# Patient Record
Sex: Female | Born: 1975 | Race: White | Hispanic: No | Marital: Married | Smoking: Never smoker
Health system: Southern US, Community
[De-identification: ages and names within clinical notes are randomized; demographics above are authoritative.]

## PROBLEM LIST (undated history)

## (undated) DIAGNOSIS — T7840XA Allergy, unspecified, initial encounter: Secondary | ICD-10-CM

## (undated) HISTORY — PX: HERNIA REPAIR: SHX51

## (undated) HISTORY — PX: RHINOPLASTY: SUR1284

## (undated) HISTORY — PX: AUGMENTATION MAMMAPLASTY: SUR837

## (undated) HISTORY — PX: TONSILLECTOMY: SUR1361

## (undated) HISTORY — DX: Allergy, unspecified, initial encounter: T78.40XA

---

## 2001-12-21 ENCOUNTER — Other Ambulatory Visit: Admission: RE | Admit: 2001-12-21 | Discharge: 2001-12-21 | Payer: Self-pay | Admitting: Obstetrics and Gynecology

## 2002-04-07 ENCOUNTER — Inpatient Hospital Stay (HOSPITAL_COMMUNITY): Admission: AD | Admit: 2002-04-07 | Discharge: 2002-04-07 | Payer: Self-pay | Admitting: Obstetrics and Gynecology

## 2002-05-16 ENCOUNTER — Inpatient Hospital Stay (HOSPITAL_COMMUNITY): Admission: AD | Admit: 2002-05-16 | Discharge: 2002-05-16 | Payer: Self-pay | Admitting: Obstetrics and Gynecology

## 2004-06-21 ENCOUNTER — Emergency Department: Payer: Self-pay | Admitting: Emergency Medicine

## 2006-10-15 ENCOUNTER — Observation Stay: Payer: Self-pay

## 2006-11-05 ENCOUNTER — Observation Stay: Payer: Self-pay | Admitting: Obstetrics and Gynecology

## 2006-11-06 ENCOUNTER — Observation Stay: Payer: Self-pay

## 2006-11-12 ENCOUNTER — Ambulatory Visit: Payer: Self-pay | Admitting: Obstetrics and Gynecology

## 2009-03-31 ENCOUNTER — Emergency Department: Payer: Self-pay | Admitting: Unknown Physician Specialty

## 2009-04-01 ENCOUNTER — Inpatient Hospital Stay: Payer: Self-pay | Admitting: Psychiatry

## 2009-07-14 ENCOUNTER — Ambulatory Visit: Payer: Self-pay

## 2009-07-20 ENCOUNTER — Ambulatory Visit: Payer: Self-pay

## 2009-07-21 HISTORY — PX: DILATION AND CURETTAGE OF UTERUS: SHX78

## 2009-07-25 LAB — PATHOLOGY REPORT

## 2010-12-07 ENCOUNTER — Ambulatory Visit: Payer: Self-pay | Admitting: Otolaryngology

## 2011-02-06 ENCOUNTER — Ambulatory Visit: Payer: Self-pay | Admitting: Otolaryngology

## 2011-02-07 LAB — PATHOLOGY REPORT

## 2011-09-11 ENCOUNTER — Emergency Department: Payer: Self-pay | Admitting: Emergency Medicine

## 2012-04-09 ENCOUNTER — Emergency Department: Payer: Self-pay | Admitting: Emergency Medicine

## 2012-04-09 LAB — CBC
HGB: 13.8 g/dL (ref 12.0–16.0)
MCH: 33.4 pg (ref 26.0–34.0)
Platelet: 212 10*3/uL (ref 150–440)
RDW: 12.2 % (ref 11.5–14.5)

## 2012-04-09 LAB — LIPASE, BLOOD: Lipase: 242 U/L (ref 73–393)

## 2012-04-09 LAB — COMPREHENSIVE METABOLIC PANEL
Albumin: 4.3 g/dL (ref 3.4–5.0)
Alkaline Phosphatase: 42 U/L — ABNORMAL LOW (ref 50–136)
BUN: 14 mg/dL (ref 7–18)
Calcium, Total: 8.5 mg/dL (ref 8.5–10.1)
EGFR (Non-African Amer.): >60
Glucose: 91 mg/dL (ref 65–99)
Osmolality: 274 (ref 275–301)
Potassium: 4 mmol/L (ref 3.5–5.1)
SGOT(AST): 12 U/L — ABNORMAL LOW (ref 15–37)
Sodium: 137 mmol/L (ref 136–145)

## 2012-04-09 LAB — URINALYSIS, COMPLETE
Ketone: NEGATIVE
Nitrite: NEGATIVE
Ph: 7 (ref 4.5–8.0)
Protein: NEGATIVE
RBC,UR: <1 /HPF (ref 0–5)
Specific Gravity: 1.003 (ref 1.003–1.030)
Squamous Epithelial: <1

## 2013-10-29 ENCOUNTER — Ambulatory Visit: Payer: Self-pay

## 2014-05-15 NOTE — Op Note (Signed)
PATIENT NAME:  Donna James, Donna James MR#:  [id number] DATE OF BIRTH:  03/14/1975  DATE OF PROCEDURE:  02/06/2011  PREOPERATIVE DIAGNOSIS: Right chronic sialadenitis with sialolithiasis.    POSTOPERATIVE DIAGNOSIS: Right chronic sialadenitis with sialolithiasis.    PROCEDURE: Excision of right submandibular gland and stone.   SURGEON: Ollen Grossaul S. Willeen CassBennett, MD  FIRST ASSISTANT: Karlyne GreenspanWilliam Vaught, M.D.   ANESTHESIA: General endotracheal.   INDICATIONS: 39 year old female with chronic recurrent swelling of the right submandibular gland with a stone noted within the gland on CT scan.   FINDINGS: The gland was firm and appeared chronically inflamed with adhesions over the capsule of the gland and about a 5 mm stone near the hilum of the duct.   COMPLICATIONS: None.   DESCRIPTION OF PROCEDURE: After obtaining informed consent, the patient was taken to the Operating Room, placed in supine position. After induction of general endotracheal anesthesia, the patient was turned 90 degrees and placed on a shoulder roll. The skin was injected with 1% lidocaine with epinephrine 1:200,000. She was then prepped and draped in the usual sterile fashion. A 15 blade was used to incise the skin. The incision subsequently carried through the subcutaneous tissues and through the platysma. Care was taken during dissection to watch for any movement related to the branches of the marginal mandibular nerve. With dissection proceeding deep to the platysma the lower border of the submandibular gland was dissected out and identified. The facial vein was clamped, suture ligated and retracted superiorly to help protect the marginal mandibular nerve. Dissection then proceeded over the capsule of the gland. The marginal mandibular nerve was not identified nor was there any twitching of the lower lip during the dissection and I was able to safely dissect the gland away with careful dissection using the Harmonic scalpel to divide fascial  attachments. There were some fascial adhesions to the gland because of inflammation. The branches from the facial artery were identified and divided with the Harmonic scalpel. The gland was dissected away from the mylohyoid muscle which was identified. More superiorly there was an accessory gland that was pulled down and dissected out and then the lingual nerve identified. The ganglion branch from the lingual nerve was divided with the Harmonic scalpel preserving the lingual nerve itself. The stone was identified in the hilum of the duct and the mylohyoid muscle lifted in the duct, divided up under the mylohyoid muscle. The gland was then delivered. The stone was removed from the hilum of the duct to give to the patient and the gland sent for pathology. Palpated the floor of the mouth under the mylohyoid muscle and there was no residual stone palpable. The wound was irrigated with saline and a #7 TLS drain placed through the skin and secured with a 5-0 Prolene suture. The platysmal layer was closed with 4-0 Vicryl suture in an interrupted fashion. The subcutaneous tissues were then further closed with 4-0 Vicryl and the skin closed with a 5-0 Prolene running lock stitch. Patient was then returned to the anesthesiologist for awakening. Bacitracin ointment was placed in the wound. She was awakened and taken to the recovery room in good condition postoperatively. Blood loss was less than 25 mL.  ____________________________ Ollen GrossPaul S. Willeen CassBennett, MD psb:cms D: 02/06/2011 09:08:51 ET James: 02/06/2011 09:25:38 ET JOB#: [id number]  cc: Ollen GrossPaul S. Willeen CassBennett, MD, <Dictator> Sandi MealyPAUL S Zoey Gilkeson MD ELECTRONICALLY SIGNED 02/20/2011 17:51

## 2017-01-24 ENCOUNTER — Ambulatory Visit: Payer: Self-pay | Admitting: Internal Medicine

## 2017-01-24 DIAGNOSIS — Z0289 Encounter for other administrative examinations: Secondary | ICD-10-CM

## 2017-05-08 ENCOUNTER — Encounter: Payer: Self-pay | Admitting: *Deleted

## 2017-05-13 ENCOUNTER — Encounter: Payer: Self-pay | Admitting: General Surgery

## 2017-05-13 ENCOUNTER — Ambulatory Visit: Payer: BC Managed Care – PPO | Admitting: General Surgery

## 2017-05-13 VITALS — BP 112/78 | HR 64 | Resp 12 | Ht 63.0 in | Wt 116.0 lb

## 2017-05-13 DIAGNOSIS — K429 Umbilical hernia without obstruction or gangrene: Secondary | ICD-10-CM | POA: Diagnosis not present

## 2017-05-13 NOTE — Patient Instructions (Addendum)
May apply ice to the area for comfort as needed. Tylenol, aleve or pain medication of choice as needed for comfort. Follow up as needed or sooner if symptoms worsen    Hernia, Adult A hernia is the bulging of an organ or tissue through a weak spot in the muscles of the abdomen (abdominal wall). Hernias develop most often near the navel or groin. There are many kinds of hernias. Common kinds include:  Femoral hernia. This kind of hernia develops under the groin in the upper thigh area.  Inguinal hernia. This kind of hernia develops in the groin or scrotum.  Umbilical hernia. This kind of hernia develops near the navel.  Hiatal hernia. This kind of hernia causes part of the stomach to be pushed up into the chest.  Incisional hernia. This kind of hernia bulges through a scar from an abdominal surgery.  What are the causes? This condition may be caused by:  Heavy lifting.  Coughing over a long period of time.  Straining to have a bowel movement.  An incision made during an abdominal surgery.  A birth defect (congenital defect).  Excess weight or obesity.  Smoking.  Poor nutrition.  Cystic fibrosis.  Excess fluid in the abdomen.  Undescended testicles.  What are the signs or symptoms? Symptoms of a hernia include:  A lump on the abdomen. This is the first sign of a hernia. The lump may become more obvious with standing, straining, or coughing. It may get bigger over time if it is not treated or if the condition causing it is not treated.  Pain. A hernia is usually painless, but it may become painful over time if treatment is delayed. The pain is usually dull and may get worse with standing or lifting heavy objects.  Sometimes a hernia gets tightly squeezed in the weak spot (strangulated) or stuck there (incarcerated) and causes additional symptoms. These symptoms may include:  Vomiting.  Nausea.  Constipation.  Irritability.  How is this diagnosed? A hernia  may be diagnosed with:  A physical exam. During the exam your health care provider may ask you to cough or to make a specific movement, because a hernia is usually more visible when you move.  Imaging tests. These can include: ? X-rays. ? Ultrasound. ? CT scan.  How is this treated? A hernia that is small and painless may not need to be treated. A hernia that is large or painful may be treated with surgery. Inguinal hernias may be treated with surgery to prevent incarceration or strangulation. Strangulated hernias are always treated with surgery, because lack of blood to the trapped organ or tissue can cause it to die. Surgery to treat a hernia involves pushing the bulge back into place and repairing the weak part of the abdomen. Follow these instructions at home:  Avoid straining.  Do not lift anything heavier than 10 lb (4.5 kg).  Lift with your leg muscles, not your back muscles. This helps avoid strain.  When coughing, try to cough gently.  Prevent constipation. Constipation leads to straining with bowel movements, which can make a hernia worse or cause a hernia repair to break down. You can prevent constipation by: ? Eating a high-fiber diet that includes plenty of fruits and vegetables. ? Drinking enough fluids to keep your urine clear or pale yellow. Aim to drink 6-8 glasses of water per day. ? Using a stool softener as directed by your health care provider.  Lose weight, if you are overweight.  Do  not use any tobacco products, including cigarettes, chewing tobacco, or electronic cigarettes. If you need help quitting, ask your health care provider.  Keep all follow-up visits as directed by your health care provider. This is important. Your health care provider may need to monitor your condition. Contact a health care provider if:  You have swelling, redness, and pain in the affected area.  Your bowel habits change. Get help right away if:  You have a fever.  You have  abdominal pain that is getting worse.  You feel nauseous or you vomit.  You cannot push the hernia back in place by gently pressing on it while you are lying down.  The hernia: ? Changes in shape or size. ? Is stuck outside the abdomen. ? Becomes discolored. ? Feels hard or tender. This information is not intended to replace advice given to you by your health care provider. Make sure you discuss any questions you have with your health care provider. Document Released: 01/07/2005 Document Revised: 06/07/2015 Document Reviewed: 11/17/2013 Elsevier Interactive Patient Education  2017 [employer]

## 2017-05-13 NOTE — Progress Notes (Signed)
Patient ID: Donna James, female   DOB: 10-30-1975, 42 y.o.   MRN: [id number]  Chief Complaint  Patient presents with  . Other    HPI Donna James is a 42 y.o. female.  Patient here today for an evaluation of a possible hernia referred by Lucretia Kern.  She states that she has noticed a umbilical knot for about 1 week  It does seem to be causing some abdominal pain.  She does admit to nausea but no vomiting, constipation or diarrhea noted. She states they are in the process of moving but she has not lifted anything heavy. Sh e does admit to a lot of allergies and has been sneezing more. She is here with her son, Donna James. She is a hair stylist.  HPI  Past Medical History:  Diagnosis Date  . Allergy       Family History  Problem Relation Age of Onset  . Diabetes Mother   . Diabetes Father     Social History Social History   Tobacco Use  . Smoking status: Never Smoker  . Smokeless tobacco: Never Used  Substance Use Topics  . Alcohol use: Yes    Alcohol/week: 0.6 oz    Types: 1 Glasses of wine per week  . Drug use: Never    Allergies  Allergen Reactions  . Pear Hives    Current Outpatient Medications  Medication Sig Dispense Refill  . loratadine (CLARITIN) 10 MG tablet Take 10 mg by mouth daily.     No current facility-administered medications for this visit.     Review of Systems Review of Systems  Constitutional: Negative.   Respiratory: Negative.   Cardiovascular: Negative.   Gastrointestinal: Positive for abdominal pain and nausea. Negative for constipation and diarrhea.    Blood pressure 112/78, pulse 64, resp. rate 12, height 5\' 3"  (1.6 m), weight 116 lb (52.6 kg), last menstrual period 04/26/2017, SpO2 98 %.  Physical Exam Physical Exam  Constitutional: She is oriented to person, place, and time. She appears well-developed and well-nourished.  HENT:  Mouth/Throat: Oropharynx is clear and moist.  Eyes: Conjunctivae are  normal. No scleral icterus.  Neck: Neck supple.  Cardiovascular: Normal rate, regular rhythm and normal heart sounds.  Pulmonary/Chest: Effort normal and breath sounds normal.  Abdominal: Soft. Normal appearance and bowel sounds are normal. There is no tenderness. A hernia is present.    Tiny umbilical hernia   Lymphadenopathy:    She has no cervical adenopathy.  Neurological: She is alert and oriented to person, place, and time.  Skin: Skin is warm and dry.  Psychiatric: Her behavior is normal.       Assessment    Possible small umbilical hernia.     Plan    I anticipate this area will become asymptomatic in the coming weeks, and does not warrant  intervention unless it fails to improve or enlarges.  No change in activity required.  May apply ice to the area for comfort as needed. Tylenol, aleve or pain medication of choice as needed for comfort.  Follow up as needed or sooner if symptoms worsen       HPI, Physical Exam, Assessment and Plan have been scribed under the direction and in the presence of Earline Mayotte, MD. Dorathy Daft, RN  I have completed the exam and reviewed the above documentation for accuracy and completeness.  I agree with the above.  Museum/gallery conservator has been used and any errors in dictation or transcription are unintentional.  Donnalee CurryJeffrey Cena Bruhn, M.D., F.A.C.S.  Merrily PewJeffrey W Mabrey Howland 05/13/2017, 9:50 PM

## 2018-05-21 ENCOUNTER — Other Ambulatory Visit: Payer: Self-pay

## 2018-05-21 ENCOUNTER — Other Ambulatory Visit (HOSPITAL_COMMUNITY)
Admission: RE | Admit: 2018-05-21 | Discharge: 2018-05-21 | Disposition: A | Discharge status: Home / Self Care | Payer: BC Managed Care – PPO | Source: Ambulatory Visit | Attending: Maternal Newborn | Admitting: Maternal Newborn

## 2018-05-21 ENCOUNTER — Ambulatory Visit (INDEPENDENT_AMBULATORY_CARE_PROVIDER_SITE_OTHER): Payer: BC Managed Care – PPO | Admitting: Maternal Newborn

## 2018-05-21 ENCOUNTER — Encounter: Payer: Self-pay | Admitting: Maternal Newborn

## 2018-05-21 VITALS — BP 98/72 | HR 75 | Ht 63.0 in | Wt 124.0 lb

## 2018-05-21 DIAGNOSIS — Z124 Encounter for screening for malignant neoplasm of cervix: Secondary | ICD-10-CM | POA: Diagnosis not present

## 2018-05-21 DIAGNOSIS — N921 Excessive and frequent menstruation with irregular cycle: Secondary | ICD-10-CM | POA: Diagnosis not present

## 2018-05-21 NOTE — Progress Notes (Signed)
Obstetrics & Gynecology Office Visit   Chief Complaint:  Chief Complaint  Patient presents with  . Irregular cycle    Bleeding x 1 month, vaginal discharge    History of Present Illness: Donna James has had a history of irregular cycles following the birth of her last child in 2009. She had a D&C in 2011 with Dr. Luella Cook for abnormal uterine bleeding/menorrhagia and her menses ceased for a while after the procedure. When they returned, her cycles were irregular, sometimes as often as every 2 weeks, lasting for 7 days, with heavy bleeding throughout her cycle. She began to exercise and train for a marathon, and then had a one year period of amenorrhea. Three weeks ago, she had a menstrual cycle again. It was heavy, with clots, and dysmenorrhea for the first few days. It then lessened to brown discharge at the end. She has not had any bleeding for the past day. She denies pelvic pain, abnormal vaginal discharge, vaginal irritation or itching, and exposure to STDs.  Review of Systems: Review of systems negative unless otherwise noted in HPI.  Past Medical History:  Past Medical History:  Diagnosis Date  . Allergy     Past Surgical History:  Past Surgical History:  Procedure Laterality Date  . AUGMENTATION MAMMAPLASTY    . DILATION AND CURETTAGE OF UTERUS  07/2009   AUB, [facility]  . HERNIA REPAIR    . RHINOPLASTY    . TONSILLECTOMY      Gynecologic History: Patient's last menstrual period was 04/19/2018 (approximate).  Obstetric History: [id number]  Family History:  Family History  Problem Relation Age of Onset  . Diabetes Mother   . Diabetes Father     Social History:  Social History   Socioeconomic History  . Marital status: Married    Spouse name: Not on file  . Number of children: Not on file  . Years of education: Not on file  . Highest education level: Not on file  Occupational History  . Not on file  Social Needs  . Financial resource strain: Not on file  .  Food insecurity:    Worry: Not on file    Inability: Not on file  . Transportation needs:    Medical: Not on file    Non-medical: Not on file  Tobacco Use  . Smoking status: Never Smoker  . Smokeless tobacco: Never Used  Substance and Sexual Activity  . Alcohol use: Yes    Alcohol/week: 1.0 standard drinks    Types: 1 Glasses of wine per week  . Drug use: Never  . Sexual activity: Yes    Birth control/protection: None  Lifestyle  . Physical activity:    Days per week: Not on file    Minutes per session: Not on file  . Stress: Not on file  Relationships  . Social connections:    Talks on phone: Not on file    Gets together: Not on file    Attends religious service: Not on file    Active member of club or organization: Not on file    Attends meetings of clubs or organizations: Not on file    Relationship status: Not on file  . Intimate partner violence:    Fear of current or ex partner: Not on file    Emotionally abused: Not on file    Physically abused: Not on file    Forced sexual activity: Not on file  Other Topics Concern  . Not on file  Social  History Narrative  . Not on file    Allergies:  Allergies  Allergen Reactions  . Pear Hives    Medications: Prior to Admission medications   Medication Sig Start Date End Date Taking? Authorizing Provider  valACYclovir (VALTREX) 1000 MG tablet  02/11/18  Yes [provider]    Physical Exam Vitals:  Vitals:   05/21/18 0844  BP: 98/72  Pulse: 75   Patient's last menstrual period was 04/19/2018 (approximate).  General: NAD HEENT: normocephalic, anicteric Pulmonary: No increased work of breathing Genitourinary:  External: Normal external female genitalia.  Normal urethral  meatus, normal Bartholin's and Skene's glands.    Vagina: Normal vaginal mucosa, no evidence of prolapse.    Cervix: Grossly normal in appearance, no bleeding or discharge  Uterus: Non-enlarged, mobile, normal contour.  No CMT   Adnexa: ovaries non-enlarged, no adnexal masses  Rectal: deferred  Lymphatic: no evidence of inguinal lymphadenopathy Neurologic: Grossly intact Psychiatric: mood appropriate, affect full  Assessment: 43 y.o. [id number] with menorrhagia and irregular cycle with the return of her menses after one year of amenorrhea.  Plan: Problem List Items Addressed This Visit    None    Visit Diagnoses    Menorrhagia with irregular cycle    -  Primary   Pap smear for cervical cancer screening       Relevant Orders   Cytology - PAP     1) Discussed with patient that her first cycle after a long period of amenorrhea may be heavy; talked about indications for ultrasound examination. She will wait to see what her cycle is like next month and call for follow up with ultrasound if she continues to have abnormally long/heavy periods.  2) Pap done today, last Pap 05/25/2008, NILM.  Marcelyn BruinsJacelyn Benyamin Jeff, CNM 05/21/2018

## 2018-05-28 LAB — CYTOLOGY - PAP
Diagnosis: NEGATIVE
HPV: NOT DETECTED

## 2019-07-08 ENCOUNTER — Other Ambulatory Visit: Payer: Self-pay

## 2019-07-08 ENCOUNTER — Ambulatory Visit (INDEPENDENT_AMBULATORY_CARE_PROVIDER_SITE_OTHER): Payer: Self-pay | Admitting: Dermatology

## 2019-07-08 DIAGNOSIS — L988 Other specified disorders of the skin and subcutaneous tissue: Secondary | ICD-10-CM

## 2019-07-08 NOTE — Progress Notes (Signed)
   Follow-Up Visit   Subjective  Donna James is a 44 y.o. female who presents for the following: Facial Elastosis.  Patient here today for Botox injections. She is pleased with her last treatment at the glabella and forehead but would like to discuss adding Botox to crows feet.   The following portions of the chart were reviewed this encounter and updated as appropriate:  Tobacco  Allergies  Meds  Problems  Med Hx  Surg Hx  Fam Hx      Review of Systems:  No other skin or systemic complaints except as noted in HPI or Assessment and Plan.  Objective  Well appearing patient in no apparent distress; mood and affect are within normal limits.  A focused examination was performed including face. Relevant physical exam findings are noted in the Assessment and Plan.  Objective  Glabella, forehead, crow's feet: Rhytides and volume loss.   Images               Assessment & Plan    Elastosis of skin Glabella, forehead, crow's feet  Botox Injection - Glabella, forehead, crow's feet Location: See attached image  Informed consent: Discussed risks (infection, pain, bleeding, bruising, swelling, allergic reaction, paralysis of nearby muscles, eyelid droop, double vision, neck weakness, difficulty breathing, headache, undesirable cosmetic result, and need for additional treatment) and benefits of the procedure, as well as the alternatives.  Informed consent was obtained.  Preparation: The area was cleansed with alcohol.  Procedure Details:  Botox was injected into the dermis with a 30-gauge needle. Pressure applied to any bleeding. Ice packs offered for swelling.  Lot Number:  [id number] Expiration:  09/2021  Total Units Injected:  45  Plan: Patient was instructed to remain upright for 4 hours. Patient was instructed to avoid massaging the face and avoid vigorous exercise for the rest of the day. Tylenol may be used for headache.  Allow 2 weeks before  returning to clinic for additional dosing as needed. Patient will call for any problems.   Return in about 3 months (around 10/08/2019) for Botox.  Anise Salvo, RMA, am acting as scribe for Armida Sans, MD . Documentation: I have reviewed the above documentation for accuracy and completeness, and I agree with the above.  Armida Sans, MD

## 2019-07-11 ENCOUNTER — Encounter: Payer: Self-pay | Admitting: Dermatology

## 2020-01-04 ENCOUNTER — Ambulatory Visit (INDEPENDENT_AMBULATORY_CARE_PROVIDER_SITE_OTHER): Payer: Self-pay | Admitting: Dermatology

## 2020-01-04 ENCOUNTER — Other Ambulatory Visit: Payer: Self-pay

## 2020-01-04 DIAGNOSIS — L988 Other specified disorders of the skin and subcutaneous tissue: Secondary | ICD-10-CM

## 2020-01-04 NOTE — Progress Notes (Signed)
   Follow-Up Visit   Subjective  Donna James is a 44 y.o. female who presents for the following: Facial Elastosis (Face, pt presents for botox, last txt 07/08/19).  The following portions of the chart were reviewed this encounter and updated as appropriate:   Tobacco  Allergies  Meds  Problems  Med Hx  Surg Hx  Fam Hx     Review of Systems:  No other skin or systemic complaints except as noted in HPI or Assessment and Plan.  Objective  Well appearing patient in no apparent distress; mood and affect are within normal limits.  A focused examination was performed including face. Relevant physical exam findings are noted in the Assessment and Plan.  Objective  face: Rhytides and volume loss.   Images     Assessment & Plan  Elastosis of skin face  Botox 45units injected as follows: - Frown complex, 20 units - Forehead 5 units - Crow's feet 20 units (10 x 2)  Botox Injection - face Location: Frown complex, forehead, crow's feet  Informed consent: Discussed risks (infection, pain, bleeding, bruising, swelling, allergic reaction, paralysis of nearby muscles, eyelid droop, double vision, neck weakness, difficulty breathing, headache, undesirable cosmetic result, and need for additional treatment) and benefits of the procedure, as well as the alternatives.  Informed consent was obtained.  Preparation: The area was cleansed with alcohol.  Procedure Details:  Botox was injected into the dermis with a 30-gauge needle. Pressure applied to any bleeding. Ice packs offered for swelling.  Lot Number:  [id number] Expiration:  03/2022  Total Units Injected:  45  Plan: Patient was instructed to remain upright for 4 hours. Patient was instructed to avoid massaging the face and avoid vigorous exercise for the rest of the day. Tylenol may be used for headache.  Allow 2 weeks before returning to clinic for additional dosing as needed. Patient will call for any  problems.   Return for 3-45m Botox.   I, Ardis Rowan, RMA, am acting as scribe for Armida Sans, MD .  Documentation: I have reviewed the above documentation for accuracy and completeness, and I agree with the above.  Armida Sans, MD

## 2020-01-09 ENCOUNTER — Encounter: Payer: Self-pay | Admitting: Dermatology

## 2020-12-26 ENCOUNTER — Other Ambulatory Visit: Payer: Self-pay

## 2020-12-26 ENCOUNTER — Ambulatory Visit (INDEPENDENT_AMBULATORY_CARE_PROVIDER_SITE_OTHER): Payer: BC Managed Care – PPO | Admitting: Dermatology

## 2020-12-26 DIAGNOSIS — L988 Other specified disorders of the skin and subcutaneous tissue: Secondary | ICD-10-CM

## 2020-12-26 NOTE — Patient Instructions (Signed)

## 2020-12-26 NOTE — Progress Notes (Signed)
   Follow-Up Visit   Subjective  Donna James is a 45 y.o. female who presents for the following: Facial Elastosis (Patient is here today for Botox injections. She is not interested in injecting the forehead today. ).  The following portions of the chart were reviewed this encounter and updated as appropriate:   Tobacco  Allergies  Meds  Problems  Med Hx  Surg Hx  Fam Hx     Review of Systems:  No other skin or systemic complaints except as noted in HPI or Assessment and Plan.  Objective  Well appearing patient in no apparent distress; mood and affect are within normal limits.  A focused examination was performed including the face. Relevant physical exam findings are noted in the Assessment and Plan.  Face Rhytides and volume loss.                   Assessment & Plan  Elastosis of skin Face  Botox 25 units injected as marked: - Frown complex 20 units - Forehead 5 units  Crow's feet not injected today.  Botox Injection - Face Location: See attached image  Informed consent: Discussed risks (infection, pain, bleeding, bruising, swelling, allergic reaction, paralysis of nearby muscles, eyelid droop, double vision, neck weakness, difficulty breathing, headache, undesirable cosmetic result, and need for additional treatment) and benefits of the procedure, as well as the alternatives.  Informed consent was obtained.  Preparation: The area was cleansed with alcohol.  Procedure Details:  Botox was injected into the dermis with a 30-gauge needle. Pressure applied to any bleeding. Ice packs offered for swelling.  Lot Number:  [id number] Expiration:  03/2023  Total Units Injected:  25  Plan: Patient was instructed to remain upright for 4 hours. Patient was instructed to avoid massaging the face and avoid vigorous exercise for the rest of the day. Tylenol may be used for headache.  Allow 2 weeks before returning to clinic for additional dosing as  needed. Patient will call for any problems.   Return in about 4 months (around 04/26/2021) for Botox injections.  Donna James, CMA, am acting as scribe for Armida Sans, MD . Documentation: I have reviewed the above documentation for accuracy and completeness, and I agree with the above.  Armida Sans, MD

## 2021-01-05 ENCOUNTER — Encounter: Payer: Self-pay | Admitting: Dermatology

## 2021-03-12 ENCOUNTER — Other Ambulatory Visit (HOSPITAL_COMMUNITY)
Admission: RE | Admit: 2021-03-12 | Discharge: 2021-03-12 | Disposition: A | Discharge status: Home / Self Care | Payer: BC Managed Care – PPO | Source: Ambulatory Visit | Attending: Obstetrics and Gynecology | Admitting: Obstetrics and Gynecology

## 2021-03-12 ENCOUNTER — Encounter: Payer: Self-pay | Admitting: Obstetrics and Gynecology

## 2021-03-12 ENCOUNTER — Ambulatory Visit: Payer: BC Managed Care – PPO | Admitting: Obstetrics and Gynecology

## 2021-03-12 ENCOUNTER — Other Ambulatory Visit: Payer: Self-pay

## 2021-03-12 VITALS — BP 100/62 | Ht 63.0 in | Wt 124.0 lb

## 2021-03-12 DIAGNOSIS — Z1151 Encounter for screening for human papillomavirus (HPV): Secondary | ICD-10-CM

## 2021-03-12 DIAGNOSIS — N898 Other specified noninflammatory disorders of vagina: Secondary | ICD-10-CM

## 2021-03-12 DIAGNOSIS — N926 Irregular menstruation, unspecified: Secondary | ICD-10-CM | POA: Diagnosis not present

## 2021-03-12 DIAGNOSIS — Z1231 Encounter for screening mammogram for malignant neoplasm of breast: Secondary | ICD-10-CM

## 2021-03-12 DIAGNOSIS — Z124 Encounter for screening for malignant neoplasm of cervix: Secondary | ICD-10-CM | POA: Diagnosis present

## 2021-03-12 DIAGNOSIS — N939 Abnormal uterine and vaginal bleeding, unspecified: Secondary | ICD-10-CM

## 2021-03-12 DIAGNOSIS — R3915 Urgency of urination: Secondary | ICD-10-CM | POA: Diagnosis not present

## 2021-03-12 DIAGNOSIS — N6320 Unspecified lump in the left breast, unspecified quadrant: Secondary | ICD-10-CM

## 2021-03-12 LAB — POCT URINALYSIS DIPSTICK
Bilirubin, UA: NEGATIVE
Glucose, UA: NEGATIVE
Ketones, UA: NEGATIVE
Leukocytes, UA: NEGATIVE
Nitrite, UA: NEGATIVE
Protein, UA: NEGATIVE
Spec Grav, UA: 1.01 (ref 1.010–1.025)
pH, UA: 5 (ref 5.0–8.0)

## 2021-03-12 LAB — POCT WET PREP WITH KOH
Clue Cells Wet Prep HPF POC: NEGATIVE
KOH Prep POC: NEGATIVE
Trichomonas, UA: NEGATIVE
Yeast Wet Prep HPF POC: NEGATIVE

## 2021-03-12 LAB — POCT URINE PREGNANCY: Preg Test, Ur: NEGATIVE

## 2021-03-12 NOTE — Progress Notes (Signed)
Pcp, No   Chief Complaint  Patient presents with   Menstrual Problem    Last monthly cycle approx last August, since then no periods or very short cycles, no abnormal pain   Urinary Tract Infection    Back pain, pressure, urgency     HPI:      Ms. Donna James is a 46 y.o. [id number] whose LMP was No LMP recorded. (Menstrual status: Irregular Periods)., presents today for several issues. Has been having irregular menses for years but used to have 2-7 days of real bleeding, with heavy days for several of them, changing products every 30 min with dime-sized clots, no BTB, mild to mod dysmen, improved with NSAIDs. Since 8/22, pt has irregular bleeding/spotting, lasting a few hrs, light flow, occurring sometimes a few days a week. No dysmen. History of irregular cycles following the birth of her last child in 2009. She had a D&C in 2011 with Dr. Luella Cook for abnormal uterine bleeding/menorrhagia and her menses ceased for a while after the procedure. Also had an endometrial ablation that helped with flow for about a year. Pt is an avid runner and she feels that contributes to her irregular cycles. Neg pap/neg HPV DNA 4/20 pap She is sex active, no pain/bleeding. No new partners.  Has had increased vag d/c with odor, intermittent sx, for about a week.  Also with urinary urgency/frequency with decreased flow, pelvic pressure and RT low back pain. Pt drinking lots of caffeine daily. Sx started about a month ago. Did teledoc appt with cipro Rx for 5 days with sx relief for about a wk, but sx have recurred. No dysuria, hematuria. No UA done.  Pt has noticed a LT breast mass for several days, no change in size. Also with breast tenderness. Has never had mammo. No FH breast/ovar cancer.   Patient Active Problem List   Diagnosis Date Noted   Umbilical hernia without obstruction and without gangrene 05/13/2017    Past Surgical History:  Procedure Laterality Date   AUGMENTATION MAMMAPLASTY      DILATION AND CURETTAGE OF UTERUS  07/2009   AUB, [facility]   HERNIA REPAIR     RHINOPLASTY     TONSILLECTOMY      Family History  Problem Relation Age of Onset   Diabetes Mother    Diabetes Father     Social History   Socioeconomic History   Marital status: Married    Spouse name: Not on file   Number of children: Not on file   Years of education: Not on file   Highest education level: Not on file  Occupational History   Not on file  Tobacco Use   Smoking status: Never   Smokeless tobacco: Never  Vaping Use   Vaping Use: Never used  Substance and Sexual Activity   Alcohol use: Yes    Alcohol/week: 1.0 standard drink    Types: 1 Glasses of wine per week   Drug use: Never   Sexual activity: Yes    Birth control/protection: None  Other Topics Concern   Not on file  Social History Narrative   Not on file   Social Determinants of Health   Financial Resource Strain: Not on file  Food Insecurity: Not on file  Transportation Needs: Not on file  Physical Activity: Not on file  Stress: Not on file  Social Connections: Not on file  Intimate Partner Violence: Not on file    Outpatient Medications Prior to Visit  Medication  Sig Dispense Refill   valACYclovir (VALTREX) 1000 MG tablet      No facility-administered medications prior to visit.      ROS:  Review of Systems  Constitutional:  Negative for fever.  Gastrointestinal:  Negative for blood in stool, constipation, diarrhea, nausea and vomiting.  Genitourinary:  Positive for frequency, pelvic pain, urgency, vaginal bleeding and vaginal discharge. Negative for dyspareunia, dysuria, flank pain, hematuria and vaginal pain.  Musculoskeletal:  Positive for back pain.  Skin:  Negative for rash.  BREAST: mass/tenderness   OBJECTIVE:   Vitals:  BP 100/62    Ht 5\' 3"  (1.6 m)    Wt 124 lb (56.2 kg)    BMI 21.97 kg/m   Physical Exam Vitals reviewed.  Constitutional:      Appearance: She is well-developed.  She is not ill-appearing or toxic-appearing.  Pulmonary:     Effort: Pulmonary effort is normal.  Chest:  Breasts:    Breasts are symmetrical.     Right: No inverted nipple, mass, nipple discharge, skin change or tenderness.     Left: Mass present. No inverted nipple, nipple discharge, skin change or tenderness.    Abdominal:     Tenderness: There is right CVA tenderness. There is no left CVA tenderness.  Genitourinary:    General: Normal vulva.     Pubic Area: No rash.      Labia:        Right: No rash, tenderness or lesion.        Left: No rash, tenderness or lesion.      Vagina: Bleeding present. No vaginal discharge, erythema or tenderness.     Cervix: Normal.     Uterus: Normal. Not enlarged and not tender.      Adnexa: Right adnexa normal and left adnexa normal.       Right: No mass or tenderness.         Left: No mass or tenderness.       Comments: SMALL AMT BLOOD IN CX OS Musculoskeletal:        General: Normal range of motion.     Cervical back: Normal range of motion.  Skin:    General: Skin is warm and dry.  Neurological:     General: No focal deficit present.     Mental Status: She is alert and oriented to person, place, and time.     Cranial Nerves: No cranial nerve deficit.  Psychiatric:        Mood and Affect: Mood normal.        Behavior: Behavior normal.        Thought Content: Thought content normal.        Judgment: Judgment normal.    Results: Results for orders placed or performed in visit on 03/12/21 (from the past 24 hour(s))  POCT urine pregnancy     Status: Normal   Collection Time: 03/12/21  2:48 PM  Result Value Ref Range   Preg Test, Ur Negative Negative  POCT Wet Prep with KOH     Status: Normal   Collection Time: 03/12/21  4:52 PM  Result Value Ref Range   Trichomonas, UA Negative    Clue Cells Wet Prep HPF POC neg    Epithelial Wet Prep HPF POC     Yeast Wet Prep HPF POC neg    Bacteria Wet Prep HPF POC     RBC Wet Prep HPF POC      WBC Wet Prep HPF POC  KOH Prep POC Negative Negative  POCT Urinalysis Dipstick     Status: Abnormal   Collection Time: 03/12/21  4:52 PM  Result Value Ref Range   Color, UA pale    Clarity, UA clear    Glucose, UA Negative Negative   Bilirubin, UA neg    Ketones, UA neg    Spec Grav, UA 1.010 1.010 - 1.025   Blood, UA mod    pH, UA 5.0 5.0 - 8.0   Protein, UA Negative Negative   Urobilinogen, UA     Nitrite, UA neg    Leukocytes, UA Negative Negative   Appearance     Odor    PT WITH MENSTRUAL BLEEDING   Assessment/Plan: Abnormal uterine bleeding (AUB) - Plan: US PELVIS TRANSVAGINAL NON-OB (TV ONLY), Cytology - PAP; neg UPT; frequent spotting/bleeding; chck pap/GYN u/s. Will f/u with results and mgmt options.   Irregular menses - Plan: POCT urine pregnancy; for years, now with AUB.  Cervical cancer screening - Plan: Cytology - PAP  Screening for HPV (human papillomavirus) - Plan: Cytology - PAP  Mass of left breast, unspecified quadrant - Plan: US BREAST LTD UNI RIGHT INC AXILLA, US BREAST LTD UNI LEFT INC AXILLA, MM DIAG BREAST TOMO BILATERAL; LT breast mass 3:00 position, pt to call to [facility] mamm and u/s. Will f/u with results  Encounter for screening mammogram for malignant neoplasm of breast - Plan: US BREAST LTD UNI RIGHT INC AXILLA, US BREAST LTD UNI LEFT INC AXILLA, MM DIAG BREAST TOMO BILATERAL,   Urinary urgency - Plan: Urine Culture, POCT Urinalysis Dipstick; neg UA. Check C&S. If neg, most likely due to caffeine use. D/C caffeine, increase water. Can also be cause of LBP. F/u prn.   Vaginal discharge - Plan: POCT Wet Prep with KOH; neg wet prep/exam. F/u prn.     Return if symptoms worsen or fail to improve.  Nazyia Gaugh B. Quentez Lober, PA-C 03/12/2021 4:53 PM

## 2021-03-15 ENCOUNTER — Telehealth: Payer: Self-pay | Admitting: Obstetrics and Gynecology

## 2021-03-15 ENCOUNTER — Telehealth: Payer: Self-pay

## 2021-03-15 LAB — URINE CULTURE

## 2021-03-15 MED ORDER — FLUCONAZOLE 150 MG PO TABS: 150.0000 mg | ORAL_TABLET | Freq: Once | ORAL | 0 refills | Status: AC

## 2021-03-15 MED ORDER — FLUCONAZOLE 150 MG PO TABS: 150.0000 mg | ORAL_TABLET | Freq: Once | ORAL | 0 refills | Status: DC

## 2021-03-15 MED ORDER — AMOXICILLIN 500 MG PO CAPS
500.0000 mg | ORAL_CAPSULE | Freq: Two times a day (BID) | ORAL | 0 refills | Status: AC
Start: 2021-03-15 — End: 2021-03-22

## 2021-03-15 MED ORDER — AMOXICILLIN 500 MG PO CAPS: 500.0000 mg | ORAL_CAPSULE | Freq: Two times a day (BID) | ORAL | 0 refills | Status: DC

## 2021-03-15 NOTE — Telephone Encounter (Signed)
Pt calling; missed [facility]'s call about rx being sent in; needs it sent to [place]  [phone]  Pt aware rxs sent to [place].

## 2021-03-15 NOTE — Telephone Encounter (Signed)
LM with pos C&S. Rx amox, pt with sx. Rx diflucan prn yeast vag with abx. F/u prn.

## 2021-03-16 LAB — CYTOLOGY - PAP
Comment: NEGATIVE
Diagnosis: NEGATIVE
Diagnosis: REACTIVE
High risk HPV: NEGATIVE

## 2021-03-23 ENCOUNTER — Ambulatory Visit
Admission: RE | Admit: 2021-03-23 | Discharge: 2021-03-23 | Disposition: A | Discharge status: Home / Self Care | Payer: BC Managed Care – PPO | Source: Ambulatory Visit | Attending: Obstetrics and Gynecology | Admitting: Obstetrics and Gynecology

## 2021-03-23 ENCOUNTER — Other Ambulatory Visit: Payer: Self-pay | Admitting: Obstetrics and Gynecology

## 2021-03-23 ENCOUNTER — Other Ambulatory Visit: Payer: Self-pay

## 2021-03-23 DIAGNOSIS — Z1231 Encounter for screening mammogram for malignant neoplasm of breast: Secondary | ICD-10-CM | POA: Insufficient documentation

## 2021-03-23 DIAGNOSIS — N6320 Unspecified lump in the left breast, unspecified quadrant: Secondary | ICD-10-CM | POA: Diagnosis present

## 2021-03-29 ENCOUNTER — Other Ambulatory Visit: Payer: Self-pay | Admitting: Obstetrics and Gynecology

## 2021-03-29 ENCOUNTER — Other Ambulatory Visit: Payer: Self-pay

## 2021-03-29 ENCOUNTER — Ambulatory Visit (INDEPENDENT_AMBULATORY_CARE_PROVIDER_SITE_OTHER): Payer: BC Managed Care – PPO

## 2021-03-29 DIAGNOSIS — N939 Abnormal uterine and vaginal bleeding, unspecified: Secondary | ICD-10-CM

## 2021-03-29 NOTE — Progress Notes (Signed)
Pt with wkly bleeding/spotting since 8/22. Should she do hyst/D&C? Also just repeat u/s in 3 months for ovar cysts?

## 2021-04-02 NOTE — Progress Notes (Signed)
Sending pt to you to discuss hyst/D&C for AUB and probable polyp. Thank you

## 2021-04-17 ENCOUNTER — Other Ambulatory Visit: Payer: Self-pay

## 2021-04-17 ENCOUNTER — Encounter: Payer: Self-pay | Admitting: Obstetrics and Gynecology

## 2021-04-17 ENCOUNTER — Ambulatory Visit: Payer: BC Managed Care – PPO | Admitting: Obstetrics and Gynecology

## 2021-04-17 VITALS — BP 120/62 | Ht 63.0 in | Wt 122.0 lb

## 2021-04-17 DIAGNOSIS — N939 Abnormal uterine and vaginal bleeding, unspecified: Secondary | ICD-10-CM | POA: Diagnosis not present

## 2021-04-17 NOTE — Progress Notes (Signed)
46 yo P2 presenting today for surgical consultation for management of abnormal uterine bleeding. Patient reports irregular vaginal bleeding since December. She reports a history of 4-day monthly period and would skip months at times. However, in December a normal period started and continued as daily spotting. She also reports that at times her daily spotting will increase to a heavier flow for 24 hours before returning to light spotting. Patient denies any pelvic pain or abnormal discharge. Patient is an avid runner and reports onset of skipping period started when she increased her exercise intensity. Patient is without any other complaints ? ?Past Medical History:  ?Diagnosis Date  ? Allergy   ? ?Past Surgical History:  ?Procedure Laterality Date  ? AUGMENTATION MAMMAPLASTY    ? DILATION AND CURETTAGE OF UTERUS  07/2009  ? AUB, [facility]  ? HERNIA REPAIR    ? RHINOPLASTY    ? TONSILLECTOMY    ? ?Family History  ?Problem Relation Age of Onset  ? Diabetes Mother   ? Diabetes Father   ? ?Social History  ? ?Tobacco Use  ? Smoking status: Never  ? Smokeless tobacco: Never  ?Vaping Use  ? Vaping Use: Never used  ?Substance Use Topics  ? Alcohol use: Yes  ?  Alcohol/week: 1.0 standard drink  ?  Types: 1 Glasses of wine per week  ? Drug use: Never  ? ?ROS ?See pertinent in HPI. All other systems reviewed and non contributory ? ?Blood pressure 120/62, height 5\' 3"  (1.6 m), weight 122 lb (55.3 kg). ?GENERAL: Well-developed, well-nourished female in no acute distress.  ?Neuro: alert and oriented x 3 ? ? BREAST LTD UNI LEFT INC AXILLA ? ?Result Date: 03/23/2021 ?CLINICAL DATA:  46 year old female presenting for evaluation of a palpable lump in the left breast for 3 months. EXAM: DIGITAL DIAGNOSTIC BILATERAL MAMMOGRAM WITH IMPLANTS, CAD AND TOMOSYNTHESIS; ULTRASOUND LEFT BREAST LIMITED TECHNIQUE: Bilateral digital diagnostic mammography and breast tomosynthesis was performed. The images were evaluated with computer-aided  detection. Standard and/or implant displaced views were performed.; Targeted ultrasound examination of the left breast was performed. COMPARISON:  Previous exam(s). ACR Breast Density Category c: The breast tissue is heterogeneously dense, which may obscure small masses. FINDINGS: No suspicious masses are found deep to the skin marker indicating the palpable site of concern in the upper-outer left breast. No suspicious calcifications, masses or areas of distortion are seen in the bilateral breasts. The patient has retropectoral implants. Ultrasound targeted to the palpable site in the left breast at 2 o'clock, 2 cm from the nipple demonstrates an anechoic oval circumscribed mass measuring 0.7 x 0.3 x 0.5 cm. IMPRESSION: 1. The palpable mass in the left breast corresponds with a benign cyst. 2.  No mammographic evidence of malignancy in the bilateral breasts. RECOMMENDATION: Screening mammogram in one year.(Code:SM-B-01Y) I have discussed the findings and recommendations with the patient. If applicable, a reminder letter will be sent to the patient regarding the next appointment. BI-RADS CATEGORY  2: Benign. Electronically Signed   By: 57 M.D.   On: 03/23/2021 13:33 ? ?MM DIAG BREAST W/IMPLANT TOMO BILATERAL ? ?Result Date: 03/23/2021 ?CLINICAL DATA:  46 year old female presenting for evaluation of a palpable lump in the left breast for 3 months. EXAM: DIGITAL DIAGNOSTIC BILATERAL MAMMOGRAM WITH IMPLANTS, CAD AND TOMOSYNTHESIS; ULTRASOUND LEFT BREAST LIMITED TECHNIQUE: Bilateral digital diagnostic mammography and breast tomosynthesis was performed. The images were evaluated with computer-aided detection. Standard and/or implant displaced views were performed.; Targeted ultrasound examination of the left breast was  performed. COMPARISON:  Previous exam(s). ACR Breast Density Category c: The breast tissue is heterogeneously dense, which may obscure small masses. FINDINGS: No suspicious masses are found  deep to the skin marker indicating the palpable site of concern in the upper-outer left breast. No suspicious calcifications, masses or areas of distortion are seen in the bilateral breasts. The patient has retropectoral implants. Ultrasound targeted to the palpable site in the left breast at 2 o'clock, 2 cm from the nipple demonstrates an anechoic oval circumscribed mass measuring 0.7 x 0.3 x 0.5 cm. IMPRESSION: 1. The palpable mass in the left breast corresponds with a benign cyst. 2.  No mammographic evidence of malignancy in the bilateral breasts. RECOMMENDATION: Screening mammogram in one year.(Code:SM-B-01Y) I have discussed the findings and recommendations with the patient. If applicable, a reminder letter will be sent to the patient regarding the next appointment. BI-RADS CATEGORY  2: Benign. Electronically Signed   By: Frederico Hamman M.D.   On: 03/23/2021 13:33  ? ?US PELVIC COMPLETE WITH TRANSVAGINAL ? ?Result Date: 03/29/2021 ?Patient Name: Donna James DOB: 05-Mar-1975 MRN: [id number] ULTRASOUND REPORT Location: [facility] Date of Service: 03/29/2021 Indications:Abnormal Uterine Bleeding Findings: The uterus is anteverted and measures 10.5 x 7.2 x 5.6 cm. Echo texture is heterogenous without evidence of focal masses. The Endometrium measures 15.7 mm, is echogenic and demonstrates internal blood flow on color doppler; no discrete mass or polyp can be identified. Right Ovary measures 4.0 x 2.8 x 2.3 cm, and contains a 2.8 x 1.8 x 2.3 cm cyst with a single septation. Left Ovary measures 3.2 x 1.2 x 1.7 cm; there is a 3.2 x 2.4 x 3.9 cm para-ovarian cyst with 6 mm mural nodule.  Survey of the adnexa demonstrates no adnexal masses. There is moderate free fluid in the cul de sac and adjacent to Left ovary. Impression: 1. Suspect endometrial polyp. 2. Right Ovarian cyst with septation. 3. Left Ovarian cyst with mural nodule. Recommendations: 1.Clinical correlation with the patient's History and Physical  Exam. Sheralyn Boatman  [place] Review of ULTRASOUND.    I have personally reviewed images and report of recent ultrasound done at [facility].    Plan of management to be discussed with pa Copland. Annamarie Major, MD, FACOG [facility], [facility] 03/29/2021  11:45 AM  ? ? ? ?A/P 46 yo with abnormal uterine bleeding likely related to an endometrial polyp ?- Reviewed ultrasound findings with the patient ?- Discussed risks and benefits of an D&C hysteroscopy with polypectomy. Risks, benefits and alternatives were explained including but not limited to risks of bleeding, infection, uterine perforation and damage to adjacent organs. Patient verbalized understanding and all questions were answered ?- Patient understand that she will be scheduled in May for the procedure ?- Patient is current on mammogram and pap smear ? ?

## 2021-04-30 ENCOUNTER — Telehealth: Payer: Self-pay | Admitting: Obstetrics and Gynecology

## 2021-04-30 NOTE — Telephone Encounter (Signed)
Called pt to schedule hysteroscopy d&c with [facility]. She advised that she had seen other providers for second opinions. She has decided to go with Schermerhorn at [facility]. He did a biopsy and offered a hysterectomy.  ? ?She also was still trying to work with her insurance to dispute the 2 $94 charges she rec'd at [facility] for her visit w/ [facility] and [facility]. She stated that she was only charged $30 for her appt at [facility]. ? ?She was very appreciative of me reaching out and wasn't unhappy with her visits to out office. ? ?I will copy Denyse Dago on this not so that she is aware of the questioning of the patients co-pay amounts. ? ?Dr Elly Modena, I will cancel the surgery request, since patient is no longer interested in having the procedure done with [facility]. ?

## 2021-04-30 NOTE — Telephone Encounter (Signed)
-----   Message from Catalina Antigua, MD sent at 04/17/2021  9:18 AM EDT ----- ?Surgery Booking Request ?Patient Full Name:  Donna James  ?MRN: [id number]  ?DOB: [birth year]  ?Surgeon: Catalina Antigua, MD  ?Requested Surgery Date and Time: May ?Primary Diagnosis AND Code: Abnormal uterine bleeding ?Secondary Diagnosis and Code:  ?Surgical Procedure: Hysteroscopy D&C, possible polypectomy with Myosure ?RNFA Requested?: No ?L&D Notification: No ?Admission Status: same day surgery ?Length of Surgery: 50 min ?Special Case Needs: No ?H&P: Will do on the day of the surgery ?Phone Interview???:  No ?Interpreter: No ?Medical Clearance:  No ?Special Scheduling Instructions: No ?Any known health/anesthesia issues, diabetes, sleep apnea, latex allergy, defibrillator/pacemaker?: No ?Acuity: P3 ?  (P1 highest, P2 delay may cause harm, P3 low, elective gyn, P4 lowest) ?Post op follow up visits: 2 weeks following the surgery ? ? ?

## 2021-05-09 ENCOUNTER — Encounter: Payer: Self-pay | Admitting: Obstetrics and Gynecology

## 2021-05-09 DIAGNOSIS — Z01419 Encounter for gynecological examination (general) (routine) without abnormal findings: Secondary | ICD-10-CM

## 2023-01-09 ENCOUNTER — Encounter: Payer: Self-pay | Admitting: Emergency Medicine

## 2023-01-09 ENCOUNTER — Emergency Department
Admission: EM | Admit: 2023-01-09 | Discharge: 2023-01-11 | Disposition: A | Discharge status: Other Acute Inpatient Hospital | Payer: BC Managed Care – PPO | Attending: Emergency Medicine | Admitting: Emergency Medicine

## 2023-01-09 ENCOUNTER — Other Ambulatory Visit: Payer: Self-pay

## 2023-01-09 DIAGNOSIS — F29 Unspecified psychosis not due to a substance or known physiological condition: Secondary | ICD-10-CM | POA: Diagnosis not present

## 2023-01-09 DIAGNOSIS — F1310 Sedative, hypnotic or anxiolytic abuse, uncomplicated: Secondary | ICD-10-CM | POA: Diagnosis not present

## 2023-01-09 DIAGNOSIS — R45851 Suicidal ideations: Secondary | ICD-10-CM | POA: Diagnosis not present

## 2023-01-09 DIAGNOSIS — O99340 Other mental disorders complicating pregnancy, unspecified trimester: Secondary | ICD-10-CM | POA: Diagnosis not present

## 2023-01-09 DIAGNOSIS — O26899 Other specified pregnancy related conditions, unspecified trimester: Secondary | ICD-10-CM | POA: Diagnosis present

## 2023-01-09 DIAGNOSIS — F419 Anxiety disorder, unspecified: Secondary | ICD-10-CM

## 2023-01-09 DIAGNOSIS — G4700 Insomnia, unspecified: Secondary | ICD-10-CM | POA: Diagnosis not present

## 2023-01-09 DIAGNOSIS — Z3A00 Weeks of gestation of pregnancy not specified: Secondary | ICD-10-CM | POA: Insufficient documentation

## 2023-01-09 DIAGNOSIS — F23 Brief psychotic disorder: Secondary | ICD-10-CM

## 2023-01-09 LAB — CBC WITH DIFFERENTIAL/PLATELET
Abs Immature Granulocytes: 0.03 10*3/uL (ref 0.00–0.07)
Basophils Absolute: 0 10*3/uL (ref 0.0–0.1)
Basophils Relative: 0 %
Eosinophils Absolute: 0 10*3/uL (ref 0.0–0.5)
Eosinophils Relative: 0 %
HCT: 42.8 % (ref 36.0–46.0)
Hemoglobin: 15 g/dL (ref 12.0–15.0)
Immature Granulocytes: 0 %
Lymphocytes Relative: 18 %
Lymphs Abs: 1.8 10*3/uL (ref 0.7–4.0)
MCH: 32.7 pg (ref 26.0–34.0)
MCHC: 35 g/dL (ref 30.0–36.0)
MCV: 93.2 fL (ref 80.0–100.0)
Monocytes Absolute: 0.6 10*3/uL (ref 0.1–1.0)
Monocytes Relative: 6 %
Neutro Abs: 7.6 10*3/uL (ref 1.7–7.7)
Neutrophils Relative %: 76 %
Platelets: 335 10*3/uL (ref 150–400)
RBC: 4.59 MIL/uL (ref 3.87–5.11)
RDW: 11 % — ABNORMAL LOW (ref 11.5–15.5)
WBC: 10 10*3/uL (ref 4.0–10.5)
nRBC: 0 % (ref 0.0–0.2)

## 2023-01-09 LAB — COMPREHENSIVE METABOLIC PANEL
ALT: 15 U/L (ref 0–44)
AST: 18 U/L (ref 15–41)
Albumin: 5.3 g/dL — ABNORMAL HIGH (ref 3.5–5.0)
Alkaline Phosphatase: 41 U/L (ref 38–126)
Anion gap: 13 (ref 5–15)
BUN: 22 mg/dL — ABNORMAL HIGH (ref 6–20)
CO2: 24 mmol/L (ref 22–32)
Calcium: 9.9 mg/dL (ref 8.9–10.3)
Chloride: 102 mmol/L (ref 98–111)
Creatinine, Ser: 0.77 mg/dL (ref 0.44–1.00)
GFR, Estimated: >60 mL/min (ref >60)
Glucose, Bld: 117 mg/dL — ABNORMAL HIGH (ref 70–99)
Potassium: 3.6 mmol/L (ref 3.5–5.1)
Sodium: 139 mmol/L (ref 135–145)
Total Bilirubin: 0.9 mg/dL (ref <1.2)
Total Protein: 8.1 g/dL (ref 6.5–8.1)

## 2023-01-09 LAB — ACETAMINOPHEN LEVEL: Acetaminophen (Tylenol), Serum: <10 ug/mL — ABNORMAL LOW (ref 10–30)

## 2023-01-09 LAB — SALICYLATE LEVEL: Salicylate Lvl: <7 mg/dL — ABNORMAL LOW (ref 7.0–30.0)

## 2023-01-09 LAB — HCG, QUANTITATIVE, PREGNANCY: hCG, Beta Chain, Quant, S: 4 m[IU]/mL (ref <5)

## 2023-01-09 MED ORDER — DIPHENHYDRAMINE HCL 50 MG/ML IJ SOLN
INTRAMUSCULAR | Status: AC
  Administered 2023-01-09: 50 mg via INTRAMUSCULAR
  Filled 2023-01-09: qty 1

## 2023-01-09 MED ORDER — DIPHENHYDRAMINE HCL 50 MG/ML IJ SOLN: 50.0000 mg | Freq: Once | INTRAMUSCULAR | Status: AC

## 2023-01-09 MED ORDER — HALOPERIDOL LACTATE 5 MG/ML IJ SOLN: 5.0000 mg | Freq: Once | INTRAMUSCULAR | Status: AC

## 2023-01-09 MED ORDER — LORAZEPAM 2 MG/ML IJ SOLN
2.0000 mg | Freq: Once | INTRAMUSCULAR | Status: AC
  Filled 2023-01-09: qty 1

## 2023-01-09 MED ORDER — LORAZEPAM 2 MG/ML IJ SOLN
INTRAMUSCULAR | Status: AC
  Administered 2023-01-09: 2 mg via INTRAMUSCULAR
  Filled 2023-01-09: qty 1

## 2023-01-09 MED ORDER — HALOPERIDOL LACTATE 5 MG/ML IJ SOLN
INTRAMUSCULAR | Status: AC
  Administered 2023-01-09: 5 mg via INTRAMUSCULAR
  Filled 2023-01-09: qty 1

## 2023-01-09 NOTE — ED Notes (Addendum)
Patient belongings:  2 slippers 2 silver colored rings- one with a clear colored jewel (father took rings home with him) 1 sweatshirt 1 sweat pant 1 blue underwear 1 jacket

## 2023-01-09 NOTE — ED Notes (Signed)
Patient is refusing to provide urine sample- states "well I'm not going to do that" when need for urine sample was explained by this RN.

## 2023-01-09 NOTE — ED Notes (Signed)
TTS informed pt has received medications and would not be able to cooperate with assessment

## 2023-01-09 NOTE — ED Notes (Signed)
This RN was walking by the pt's room to check on another pt, and found the pt kneeling on the floor. Pt was asked if they need anything, and pt asked "would you pray to the lord for my soul?" The pt then began praying for forgiveness, stating that they have sinned against the ones that they are a child of god."

## 2023-01-09 NOTE — ED Notes (Addendum)
First Nurse Note;  Pt sent over from [facility]. Provider was concerned pt is having a manic episode. Pt has been having sleep disturbances and episodes of mania and depression. See note from Dr. Harlon Flor. On arrival, pt does not appear manic but pt is very hesitant to check in and took a lot of coaching to bring to triage room. Dad with patient and is able to convince her to stay. Pt very tearful.
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
## 2023-01-09 NOTE — ED Triage Notes (Signed)
Patient to ED via POV for psych evaluation- voluntary. Patient reports having frequent panic attacks over the past month. States she has been SI without a plan. Denies HI. Patient very quite and soft spoken in triage.

## 2023-01-09 NOTE — ED Notes (Signed)
Vital check was attempted at 21:15, pt refused vital check

## 2023-01-09 NOTE — ED Notes (Addendum)
Patient continually asking for a "lawyer". Patient states it is within her rights. This RN attempted to explain that she came to the hospital to seek help for anxiety and that the hospital does not have the legal resources to help with legal issues going on outside of hospital. Patient states "ok", but continues to state it is within my rights to have legal representation. This RN explained again that patient is not in trouble here and she brought herself here for help with mental health. She continues to ask for lawyers each time a staff member rounds.

## 2023-01-09 NOTE — ED Notes (Signed)
VOL  pending  consult 

## 2023-01-09 NOTE — ED Notes (Signed)
Report received by previous RN - pt currently in bed with manual hold from security staff and NT. Was given medications by prior provider see [facility]

## 2023-01-09 NOTE — ED Provider Notes (Signed)
[facility] Provider Note   Event Date/Time   First MD Initiated Contact with Patient 01/09/23 1636     (approximate) History  Psychiatric Evaluation  HPI Donna James is a 47 y.o. female with a stated past medical history of breast augmentation, rhinoplasty, and hernia repair who presents requesting a psychiatric evaluation secondary to panic attacks and suicidal ideation without a plan.  Patient repeatedly says "I did not think I needed to talk to anyone" when asked questions.  Patient states that she does not take any medications every day and is not diagnosed with any medical disorders.  Patient states that she has had a dry cough over the last 2 days with associated fever but denies any other complaints at this time ROS: Patient currently denies any vision changes, tinnitus, difficulty speaking, facial droop, chest pain, shortness of breath, abdominal pain, nausea/vomiting/diarrhea, dysuria, or weakness/numbness/paresthesias in any extremity   Physical Exam  Triage Vital Signs: ED Triage Vitals  Encounter Vitals Group     BP 01/09/23 1625 (!) 133/95     Systolic BP Percentile --      Diastolic BP Percentile --      Pulse Rate 01/09/23 1625 88     Resp 01/09/23 1625 18     Temp 01/09/23 1628 98.1 F (36.7 C)     Temp src --      SpO2 01/09/23 1625 96 %     Weight 01/09/23 1625 110 lb (49.9 kg)     Height 01/09/23 1625 5\' 3"  (1.6 m)     Head Circumference --      Peak Flow --      Pain Score 01/09/23 1625 0     Pain Loc --      Pain Education --      Exclude from Growth Chart --    Most recent vital signs: Vitals:   01/09/23 1625 01/09/23 1628  BP: (!) 133/95   Pulse: 88   Resp: 18   Temp:  98.1 F (36.7 C)  SpO2: 96%    General: Awake, oriented x4. CV:  Good peripheral perfusion.  Resp:  Normal effort.  Abd:  No distention.  Other:  Middle-aged well-developed, well-nourished Caucasian female resting comfortably in no acute  distress ED Results / Procedures / Treatments  Labs (all labs ordered are listed, but only abnormal results are displayed) Labs Reviewed  ACETAMINOPHEN LEVEL - Abnormal; Notable for the following components:      Result Value   Acetaminophen (Tylenol), Serum <10 (*)    All other components within normal limits  CBC WITH DIFFERENTIAL/PLATELET - Abnormal; Notable for the following components:   RDW 11.0 (*)    All other components within normal limits  COMPREHENSIVE METABOLIC PANEL - Abnormal; Notable for the following components:   Glucose, Bld 117 (*)    BUN 22 (*)    Albumin 5.3 (*)    All other components within normal limits  SALICYLATE LEVEL - Abnormal; Notable for the following components:   Salicylate Lvl <7.0 (*)    All other components within normal limits  HCG, QUANTITATIVE, PREGNANCY  URINE DRUG SCREEN, QUALITATIVE ([facility] ONLY)   PROCEDURES: Critical Care performed: No Procedures MEDICATIONS ORDERED IN ED: Medications  LORazepam (ATIVAN) injection 2 mg (2 mg Intramuscular Given 01/09/23 2302)  haloperidol lactate (HALDOL) injection 5 mg (5 mg Intramuscular Given 01/09/23 2303)  diphenhydrAMINE (BENADRYL) injection 50 mg (50 mg Intramuscular Given 01/09/23 2304)   IMPRESSION / MDM /  ASSESSMENT AND PLAN / ED COURSE  I reviewed the triage vital signs and the nursing notes.                             The patient is on the cardiac monitor to evaluate for evidence of arrhythmia and/or significant heart rate changes. Patient's presentation is most consistent with acute presentation with potential threat to life or bodily function. Thoughts are linear and organized, and patient has no AH, VH, or HI. Prior suicide attempt: Denies Prior Psychiatric Hospitalizations: Denies  Clinically patient displays no overt toxidrome; they are well appearing, with low suspicion for toxic ingestion given history and exam. Thoughts unlikely 2/2 anemia, hypothyroidism, infection, or  ICH. Patient placed under involuntary commitment after she was unable to be redirected secondary to hyperreligious ranting.  Patient then began screaming, throwing herself on the floor, throwing her head back, and shaking.  Patient continues to yell "do not let the evil in" as well as "I am in the light".  Patient is no longer responding to verbal interaction.  Patient provided Ativan, Haldol, and Benadryl for anxiolysis Consult: Psychiatry to evaluate patient for potential hold for danger to self. Disposition: Plan admit to psychiatry for further management of symptoms.   FINAL CLINICAL IMPRESSION(S) / ED DIAGNOSES   Final diagnoses:  Anxiety  Suicidal ideation  Acute psychosis (HCC)   Rx / DC Orders   ED Discharge Orders     None      Note:  This document was prepared using Dragon voice recognition software and may include unintentional dictation errors.   Merwyn Katos, MD 01/09/23 [phone]
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
## 2023-01-10 DIAGNOSIS — F29 Unspecified psychosis not due to a substance or known physiological condition: Secondary | ICD-10-CM | POA: Insufficient documentation

## 2023-01-10 DIAGNOSIS — R45851 Suicidal ideations: Secondary | ICD-10-CM

## 2023-01-10 DIAGNOSIS — G4700 Insomnia, unspecified: Secondary | ICD-10-CM | POA: Insufficient documentation

## 2023-01-10 LAB — URINE DRUG SCREEN, QUALITATIVE (ARMC ONLY)
Amphetamines, Ur Screen: NOT DETECTED
Barbiturates, Ur Screen: NOT DETECTED
Benzodiazepine, Ur Scrn: POSITIVE — AB
Cannabinoid 50 Ng, Ur ~~LOC~~: NOT DETECTED
Cocaine Metabolite,Ur ~~LOC~~: NOT DETECTED
MDMA (Ecstasy)Ur Screen: NOT DETECTED
Methadone Scn, Ur: NOT DETECTED
Opiate, Ur Screen: NOT DETECTED
Phencyclidine (PCP) Ur S: NOT DETECTED
Tricyclic, Ur Screen: NOT DETECTED

## 2023-01-10 LAB — PREGNANCY, URINE: Preg Test, Ur: POSITIVE — AB

## 2023-01-10 MED ORDER — HYDROXYZINE HCL 25 MG PO TABS
25.0000 mg | ORAL_TABLET | Freq: Once | ORAL | Status: AC
  Administered 2023-01-10: 25 mg via ORAL
  Filled 2023-01-10: qty 1

## 2023-01-10 NOTE — ED Notes (Signed)
Given lunch tray. Advised awaiting psych consult at this time.

## 2023-01-10 NOTE — Consult Note (Signed)
[facility] Psychiatric Consult Initial  Patient Name: .Donna James  MRN: [id number]  DOB: [birth year]  Consult Order details:  Orders (From admission, onward)     Start     Ordered   01/09/23 1647  CONSULT TO CALL ACT TEAM       Ordering Provider: Merwyn Katos, MD  Provider:  (Not yet assigned)  Question:  Reason for Consult?  Answer:  Psych consult   01/09/23 1646   01/09/23 1647  IP CONSULT TO PSYCHIATRY       Ordering Provider: Merwyn Katos, MD  Provider:  (Not yet assigned)  Question Answer Comment  Consult Timeframe ROUTINE - requires response within 24 hours   Reason for Consult? Consult for medication management   Contact phone number where the requesting provider can be reached [phone]      01/09/23 1646             Mode of Visit: Tele-visit Location of Provider [facility]    Psychiatry Consult Evaluation  Service Date: January 10, 2023 LOS:  LOS: 0 days  Chief Complaint sleep disturbance for 4 days and SI  Primary Psychiatric Diagnoses  Acute Psychosis  2.  Insomnia    Assessment  Donna James is a 47 y.o. female admitted: Presented to the [facility] 01/09/2023  4:33 PM for sleep disturbance and SI. She carries the psychiatric diagnoses of reported depression and has a past medical history of past medical history of breast augmentation, rhinoplasty, and hernia repair.   Her current presentation of lack of sleep, confusion, hyper-religious thought content, visual hallucinations are most consistent with acute psychosis. She meets criteria for inpatient psychiatric treatment based on psychosis and SI, as patient poses a safety risk to self. No current outpatient psychotropic medications currently prescribed at this time. On initial examination, patient is sitting upright in no acute distress. She is alert and oriented to person, place and time. However, she is disoriented to situation. She appears to have some thought blocking on exam AEB  decreased delayed responses. Patient states that she feels confused. She is noted to become tearful on exam. She states that her family brought her to the hospital, her mother and father because of lack of sleep. She reports sleeping 3 to 4 hours per night for the past two to three days. She states that she often wakes up around 2 or 3 am and 6 or 7 am. She denies nightmares at bedtime. She reports racing thoughts at bedtime. She reports trying melatonin for sleep and states that it was not effective. She initially reported hearing different voices at bedtime but when asked to described to voices. She states that she sees vision of things and does not hear voices. When asked to describe the visual images, she states of things that she have done or haven't done. She denies experiencing hallucinations in the past. She denies paranoia or delusion thought content. She reports a poor appetite. She describes her mood as feeling extremely anxious 9/10 with 10 being the worst. She describes her symptoms feeling afraid of what's going on, not having understanding, and confusion. She states that she was told by Dr. Harlon Flor that she would be coming to the hospital for labs, head scan and medication to help with sleep. She endorses depressive symptoms of crying, sadness, and feeling tired. She denies mania symptoms or history of mania. She denies suicidal ideations on exam. 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 She states that she does not want to kill herself. She does not further elaborate. She denies past suicide attempts. She denies HI. She denies recent stressors but states that her and her husband took on a little bit more and work hard. She states that she took out a line of credit and took a trip and that was probably selfless of her. 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 She resides with her husband and two sons ages 25 and 63 years old. She reports a past psychiatric history of depression years ago. He reports one inpatient psychiatric hospitalization at [facility] in her 49's or 63's for depression. She denies outpatient psychiatry or therapy. She states that in the past she was prescribed Paxil. She denies using illicit drugs. Patient has not given a urine sample to obtain UDS. She states that occasionally she will drink a glass of wine. She states that she last consumed alcohol, one glass of wine last week. Please see plan below for detailed recommendations.   Diagnoses:  Active Hospital problems: Principal Problem:   Insomnia Active Problems:   Psychosis (HCC)   Suicidal ideation    Plan   ## Psychiatric Medication Recommendations:  Start Zyprexa Zydis 5 mg po at bedtime for psychosis  ## Further Work-up:  -- Head CT due to new onset of psychosis and altered mental status EKG or UDS  -- Pertinent labwork reviewed earlier this admission includes: CMP, and CBC   ## Disposition:-- We recommend inpatient psychiatric hospitalization when medically cleared. Patient is under voluntary admission status at this time; please IVC if attempts to leave hospital.  ## Behavioral / Environmental: -Recommend using specific terminology regarding PNES, i.e. call the episodes "non-epileptic seizures" rather than "pseudoseizures" as the latter insinuates "fake" or "feigned" symptoms, when the events are a very real experience to the patient and are a physical, non-volitional, manifestation of fear, pain and anxiety.  or Utilize compassion and acknowledge the patient's experiences while setting clear and realistic expectations for care.    ## Safety and Observation Level:  - Based on my clinical evaluation, I estimate the patient to be at high risk of self harm in the current setting. - At this time, we recommend  1:1 Observation. This decision is  based on my review of the chart including patient's history and current presentation, interview of the patient, mental status examination, and consideration of suicide risk including evaluating suicidal ideation, plan, intent, suicidal or self-harm behaviors, risk factors, and protective factors. This judgment is based on our ability to directly address suicide risk, implement suicide prevention strategies, and develop a safety plan while the patient is in the clinical setting. Please contact our team if there is a concern that risk level has changed.  CSSR Risk Category:C-SSRS RISK CATEGORY: No Risk  Suicide Risk Assessment: Patient has following modifiable risk factors for suicide: expressive SI which we are addressing by recommending inpatient psychiatry treatment for mood stabilization. Patient has following non-modifiable or demographic risk factors for suicide: psychiatric hospitalization Patient has the following protective factors against suicide: Supportive family and Minor children in the home  Thank you for this consult request. Recommendations have been communicated to the primary team.  We will recommend inpatient psychiatric treatment at this time.   Natoya Viscomi, Chrystine Oiler, NP       History of Present Illness  Relevant Aspects  of Hospital ED Course:  Admitted on 01/09/2023 for SI and sleep disturbance.   Per EDP: Shwanda Ruchel Comp is a 47 y.o. female with a stated past medical history of breast augmentation, rhinoplasty, and hernia repair who presents requesting a psychiatric evaluation secondary to panic attacks and suicidal ideation without a plan.  Patient repeatedly says "I did not think I needed to talk to anyone" when asked questions.  Patient states that she does not take any medications every day and is not diagnosed with any medical disorders.  Patient states that she has had a dry cough over the last 2 days with associated fever but denies any other complaints at this  time ROS: Patient currently denies any vision changes, tinnitus, difficulty speaking, facial droop, chest pain, shortness of breath, abdominal pain, nausea/vomiting/diarrhea, dysuria, or weakness/numbness/paresthesias in any extremity   Patient Report:  Patient states that she feels confused. She is noted to become tearful on exam. She states that her family brought her to the hospital, her mother and father because of lack of sleep. She reports sleeping 3 to 4 hours per night for the past two to three days. She states that she often wakes up around 2 or 3 am and 6 or 7 am. She denies nightmares at bedtime. She reports racing thoughts at bedtime. She reports trying melatonin for sleep and states that it was not effective. She initially reported hearing different voices at bedtime but when asked to described to voices. She states that she sees vision of things and does not hear voices. When asked to describe the visual images, she states of things that she have done or haven't done. She denies experiencing hallucinations in the past. She denies paranoia or delusion thought content. She reports a poor appetite. She describes her mood as feeling extremely anxious 9/10 with 10 being the worst. She describes her symptoms feeling afraid of what's going on, not having understanding, and confusion. She states that she was told by Dr. Harlon Flor that she would be coming to the hospital for labs, head scan and medication to help with sleep. She endorses depressive symptoms of crying, sadness, and feeling tired. She denies mania symptoms or history of mania. She denies suicidal ideations on exam. 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 She states that she does not want to kill herself. She does not further elaborate. She denies past suicide attempts. She denies HI.  She denies recent stressors but states that her and her husband took on a little bit more and work hard. She states that she took out a line of credit and took a trip and that was probably selfless of her. 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 She resides with her husband and two sons ages 48 and 45 years old. She reports a past psychiatric history of depression years ago. He reports one inpatient psychiatric hospitalization at [facility] in her 87's or 29's for depression. She denies outpatient psychiatry or therapy. She states that in the past she was prescribed Paxil. She denies using illicit drugs. Patient has not given a urine sample to obtain UDS. She states that occasionally she will drink a glass of wine. She states that she last consumed alcohol, one glass of wine last week. Please see plan below for detailed recommendations.    Psych ROS:  Depression: depressive symptoms of crying, sadness, and feeling tired.  Anxiety:  extremely anxious 9/10 with 10 being the worst. She describes her symptoms feeling afraid of what's going on, not having understanding, and confusion. Mania (lifetime and current): denies current symptoms or histoyr. Psychosis: (lifetime and current): Reports seeing visual images. Disorganization in thought pattern. Thought blocking.   Collateral information:  I spoke to the patient's father face to face with the patient present. The patient's father states that that they would like the patient to be home for Christmas. He states that they are trying to figure out what triggered the patient's episode. He states that the patient seems to think that her friends are against her and that it will all come out on Christmas. He states that the patient's primary care doctor stated that they would come to the hospital for labs and a head CT.   Review of Systems  Constitutional: Negative.   HENT: Negative.    Eyes: Negative.   Respiratory: Negative.    Cardiovascular: Negative.    Gastrointestinal: Negative.   Genitourinary: Negative.   Musculoskeletal: Negative.   Neurological: Negative.   Endo/Heme/Allergies: Negative.      Psychiatric and Social History  Psychiatric History:  Information collected from patient   Prev Dx/Sx: history of depression  Current Psych Provider: None  Home Meds (current): None Previous Med Trials: Paxil Therapy: None  Prior Psych Hospitalization: [facility] in 20's or 30's Prior Self Harm: No Prior Violence: No  Family Psych History: Mother history of bipolar  Family Hx suicide: no history reported  Social History:  Occupational Hx: Buyer, retail Hx: None Living Situation: resides with husband and two sons  Substance History Alcohol: occasional use  Type of alcohol wine Last Drink last week Number of drinks per day 1 glass on wine History of alcohol withdrawal seizures No History of DT's No Tobacco: No Illicit drugs: No Prescription drug abuse: No Rehab hx: No  Exam Findings   Vital Signs:  Temp:  [98.1 F (36.7 C)-98.6 F (37 C)] 98.6 F (37 C) (12/20 1047) Pulse Rate:  [88-101] 101 (12/20 1047) Resp:  [16-20] 20 (12/20 1047) BP: (129-133)/(79-95) 129/82 (12/20 1047) SpO2:  [96 %] 96 % (12/20 1047) Weight:  [49.9 kg] 49.9 kg (12/19 1625) Blood pressure 129/82, pulse (!) 101, temperature 98.6 F (37 C), temperature source Oral, resp. rate 20, height 5\' 3"  (1.6 m), weight 49.9 kg, SpO2 96%. Body mass index is 19.49 kg/m.  Physical Exam Cardiovascular:     Rate and Rhythm: Normal rate.  Pulmonary:     Effort: Pulmonary effort is normal.  Musculoskeletal:        General: Normal range of motion.     Cervical back: Normal range of motion.  Neurological:     Mental Status: She is alert and oriented to person, place, and time.     Mental Status Exam: General Appearance: Casual  Orientation:  Full (Time, Place, and Person)  Memory:  Immediate;   Poor  Concentration:  Concentration: Fair  Recall:   Poor  Attention  Fair  Eye Contact:  Fair  Speech:  Slow  Language:  Fair  Volume:  Decreased  Mood: anxious   Affect:  Congruent  Thought Process:  Linear  Thought Content:  Hallucinations: Visual  Suicidal Thoughts:  No  Homicidal Thoughts:  No  Judgement:  Impaired  Insight:  Lacking  Psychomotor Activity:  Restlessness  Akathisia:  No  Fund of Knowledge:  Fair  Assets:  Communication Skills Desire for Improvement Financial Resources/Insurance Housing Intimacy Leisure Time Physical Health Social Support Talents/Skills Transportation  Cognition:  Impaired,  Moderate  ADL's:  Intact  AIMS (if indicated):        Other History   These have been pulled in through the EMR, reviewed, and updated if appropriate.  Family History:  The patient's family history includes Diabetes in her father and mother.  Medical History: Past Medical History:  Diagnosis Date  . Allergy     Surgical History: Past Surgical History:  Procedure Laterality Date  . AUGMENTATION MAMMAPLASTY    . DILATION AND CURETTAGE OF UTERUS  07/2009   AUB, [facility]  . HERNIA REPAIR    . RHINOPLASTY    . TONSILLECTOMY       Medications:  No current facility-administered medications for this encounter.  Current Outpatient Medications:  .  valACYclovir (VALTREX) 1000 MG tablet, , Disp: , Rfl:   Allergies: Allergies  Allergen Reactions  . Pear Hives    Sherena Machorro, Chrystine Oiler, NP

## 2023-01-10 NOTE — Progress Notes (Addendum)
LCSW Progress Note  [id number]   Marcheta Barclift [facility]  01/10/2023  10:01 PM    Inpatient Behavioral Health Placement  This CSW was notified to fax out due [facility]. Pt has been accepted to [facility].    Maryjean Ka, MSW, [facility] 01/10/2023 10:01 PM

## 2023-01-10 NOTE — ED Notes (Signed)
Moved to [facility] by EDT and security via wheelchair. 1 bag of belongings placed in [place] locker.

## 2023-01-10 NOTE — ED Notes (Signed)
Husband into visit with pt.

## 2023-01-10 NOTE — BH Assessment (Signed)
At the request of [facility], patient's urine results faxed over to facility.

## 2023-01-10 NOTE — ED Notes (Addendum)
Pt denies SI/HI/AVH on assessment. Came to say she wants to go home. Educated that she is IVC and has been recommended for inpatient admission. Pt asking for a lawyer saying she has rights. Staff talked to pt and educated her on IVC. Encouraged her to comply with treatment and POC. Reoriented to unit and rules. Still awaiting for pt to give urine sample and encouraging pt to do so.

## 2023-01-10 NOTE — ED Provider Notes (Signed)
-----------------------------------------   8:15 PM on 01/10/2023 -----------------------------------------   Blood pressure 129/82, pulse (!) 101, temperature 98.6 F (37 C), temperature source Oral, resp. rate 20, height 5\' 3"  (1.6 m), weight 49.9 kg, SpO2 96%.  The patient is calm and cooperative at this time.  Patient has been accepted to [facility] with bed availability for tomorrow.  Plan for transport tomorrow morning.   Janith Lima, MD 01/10/23 2016

## 2023-01-10 NOTE — ED Notes (Signed)
Advised need a urine specimen, pt states she can't pee right now. Informed to let us know when she needs to use the bathroom. Pt consented to this.

## 2023-01-10 NOTE — ED Notes (Signed)
Patient went to restroom to get urine sample. Patient states she dropped cup. Urine clear and cold. Advised patient I would need another sample.

## 2023-01-10 NOTE — ED Notes (Signed)
This tech went to take pt vitals, pt cooperative, was found kneeling on the floor at foot of bed praying, but sat on bed when this tech asked her to

## 2023-01-10 NOTE — ED Notes (Signed)
Pt seen by telepsych and [facility]. Pt also had visit with dad

## 2023-01-10 NOTE — ED Provider Notes (Signed)
Emergency Medicine Observation Re-evaluation Note  Physical Exam   BP (!) 133/95   Pulse 88   Temp 98.1 F (36.7 C)   Resp 18   Ht 5\' 3"  (1.6 m)   Wt 49.9 kg   SpO2 96%   BMI 19.49 kg/m   Patient appears in no acute distress.  ED Course / MDM   No reported events during my shift at the time of this note.   Pt is awaiting dispo from consultants   Pilar Jarvis MD    Pilar Jarvis, MD 01/10/23 [phone]

## 2023-01-10 NOTE — ED Notes (Signed)

## 2023-01-10 NOTE — BH Assessment (Signed)
Per [facility] Tresa Endo S.), patient to be referred out of system.  Referral information for Psychiatric Hospitalization faxed to;   [facility] [phone]- [phone]), no available beds.  [facility] [phone]),   [address] [phone]),  [place] [phone], [phone], [phone] or [phone]),   [place] [phone]--- [phone]--- [phone]--- [phone])  [phone]),   [facility] [phone] -or- [phone]),   [facility] [phone] phone-- [phone])  [facility] [phone]),  [place] [phone])  [facility] [phone]).  [facility] [phone]).

## 2023-01-10 NOTE — ED Notes (Signed)
IVC  CONSULT  DONE  PENDING  PLACEMENT 

## 2023-01-10 NOTE — ED Notes (Signed)
Patient complaining of anxiety. EDP made aware.

## 2023-01-10 NOTE — ED Notes (Signed)
Pt ambulated to and from bathroom, requested urine sample. Pt brings back cup that appears to be full of water instead of urine. Disposed.

## 2023-01-10 NOTE — BH Assessment (Signed)
PATIENT BED AVAILABLE AFTER 8AM ON 01/11/23  Patient has been accepted to [facility].  Accepting physician is Dr. Jonelle Sidle.  Call report to [phone] option 2.  Representative was [employer].   ER Staff is aware of it:  [facility] ER Secretary  Dr. Anner Crete, ER MD  Elmyra Ricks. Patient's Nurse

## 2023-01-11 NOTE — ED Notes (Signed)
Report given to Cyndia Bent RN for patient to be transferred to [facility] 01/11/2023.

## 2023-01-11 NOTE — ED Notes (Signed)
Pt transferring to [facility]. Advised of transfer and she verbalized understanding. Stable, ambulatory and in NAD. Given all her belongings. Escorted out to Clinical biochemist for transport.

## 2023-01-11 NOTE — ED Provider Notes (Signed)
Emergency Medicine Observation Re-evaluation Note  Donna James is a 47 y.o. female, seen on rounds today.  Pt initially presented to the ED for complaints of Psychiatric Evaluation Currently, the patient is calm in NAD.  Physical Exam  BP 130/85   Pulse 93   Temp 98.4 F (36.9 C) (Oral)   Resp 18   Ht 5\' 3"  (1.6 m)   Wt 49.9 kg   SpO2 96%   BMI 19.49 kg/m    ED Course / MDM  EKG:   I have reviewed the labs performed to date as well as medications administered while in observation.  Recent changes in the last 24 hours include none.  Plan  Current plan is for psych admit.    Donna Pollack, MD 01/11/23 [phone]

## 2023-01-11 NOTE — ED Notes (Signed)
Pt provided with breakfast tray. Pt sitting up eating

## 2023-01-11 NOTE — ED Notes (Signed)
IVC/ Pt accepted to [facility] can be transported after 8am

## 2023-01-11 NOTE — ED Notes (Addendum)
EMTALA Reviewed by this RN.  

## 2023-03-10 ENCOUNTER — Encounter: Payer: Self-pay | Admitting: Orthopedic Surgery

## 2023-03-17 ENCOUNTER — Other Ambulatory Visit: Payer: Self-pay | Admitting: Obstetrics and Gynecology

## 2023-03-17 DIAGNOSIS — N63 Unspecified lump in unspecified breast: Secondary | ICD-10-CM

## 2023-03-27 ENCOUNTER — Other Ambulatory Visit: Payer: Self-pay | Admitting: Obstetrics and Gynecology

## 2023-03-27 ENCOUNTER — Ambulatory Visit
Admission: RE | Admit: 2023-03-27 | Discharge: 2023-03-27 | Disposition: A | Discharge status: Home / Self Care | Payer: Self-pay | Source: Ambulatory Visit | Attending: Obstetrics and Gynecology | Admitting: Obstetrics and Gynecology

## 2023-03-27 DIAGNOSIS — N63 Unspecified lump in unspecified breast: Secondary | ICD-10-CM

## 2023-11-23 IMAGING — MG MM  DIGITAL DIAGNOSTIC BREAST BILAT IMPLANT W/ TOMO W/ CAD
8 of 14 series · 8 of 34 positions shown · non-contrast
Comparison: Previous exam(s).

CLINICAL DATA: 45-year-old female presenting for evaluation of a
palpable lump in the left breast for 3 months.

EXAM:
DIGITAL DIAGNOSTIC BILATERAL MAMMOGRAM WITH IMPLANTS, CAD AND
TOMOSYNTHESIS; ULTRASOUND LEFT BREAST LIMITED
TECHNIQUE: Bilateral digital diagnostic mammography and breast tomosynthesis
was performed. The images were evaluated with computer-aided
detection. Standard and/or implant displaced views were performed.;
Targeted ultrasound examination of the left breast was performed.

[R MLO]
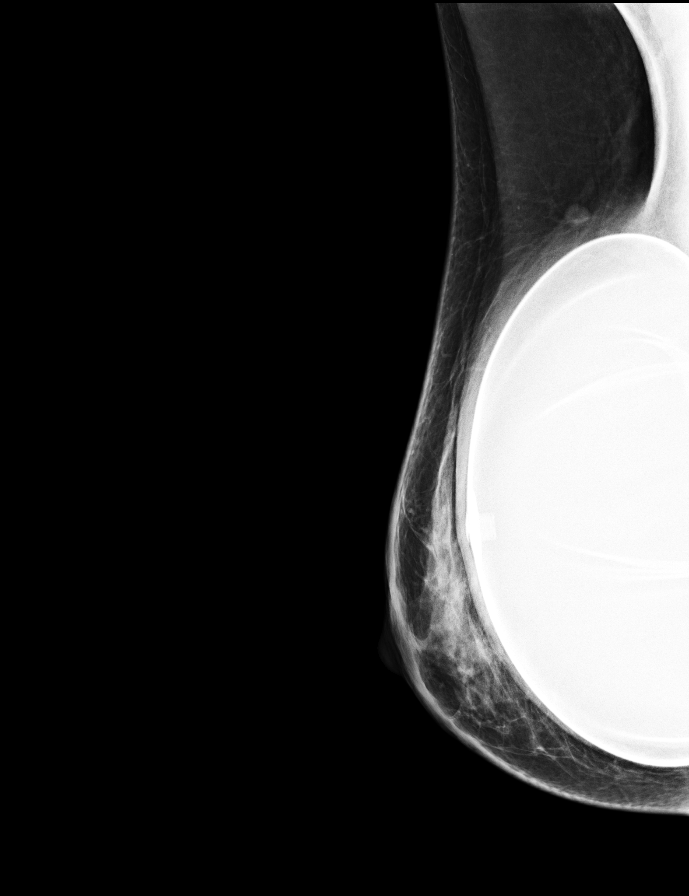

[L CC]
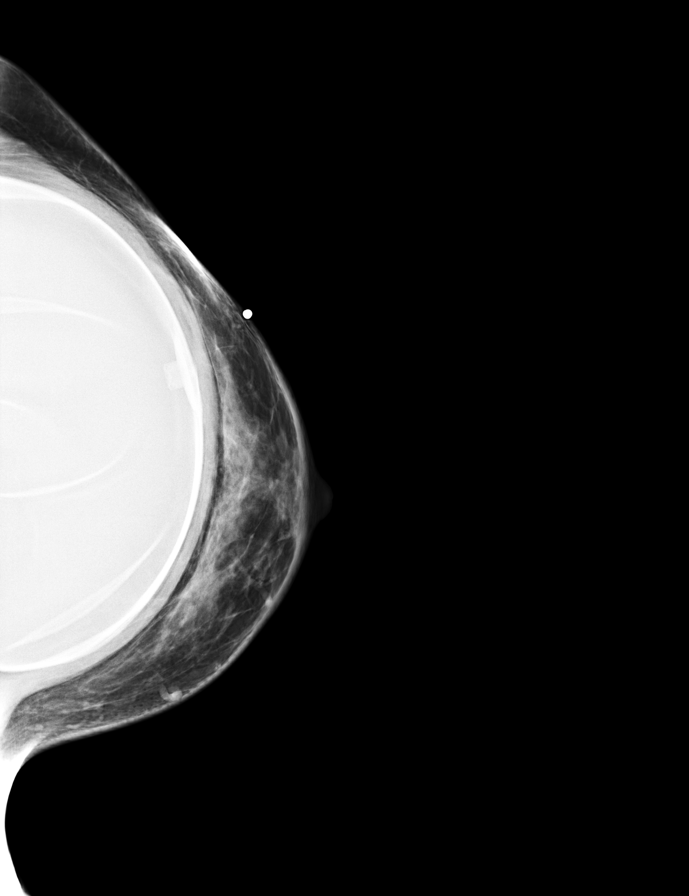

[L MLO]
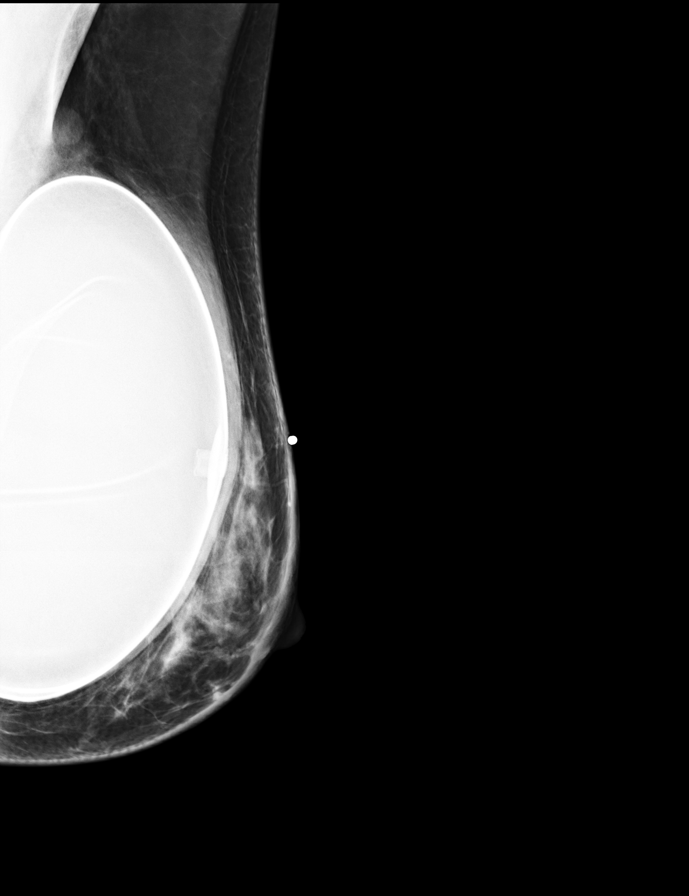

[R CC]
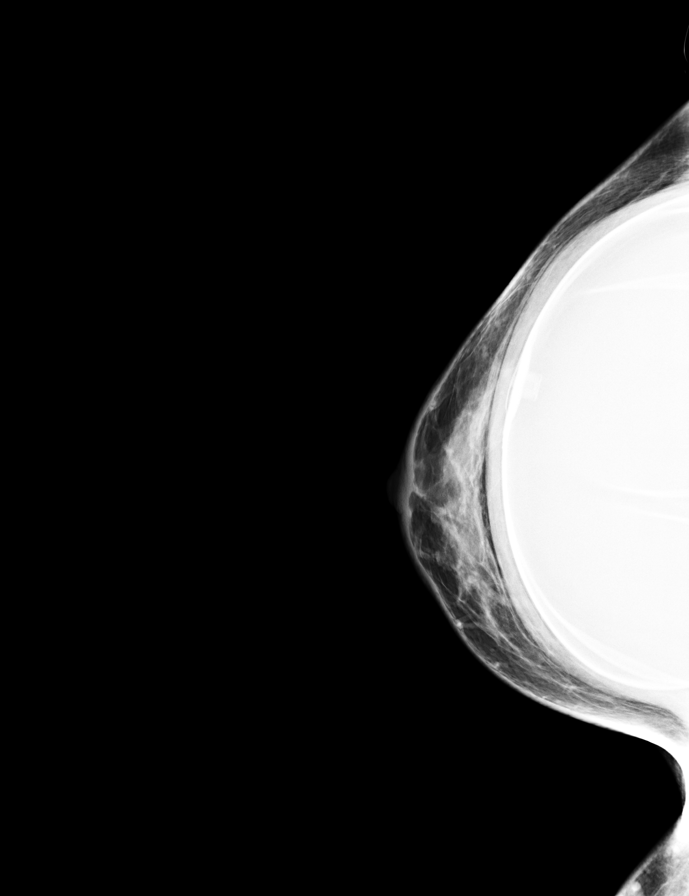

[L CC synth-2D]
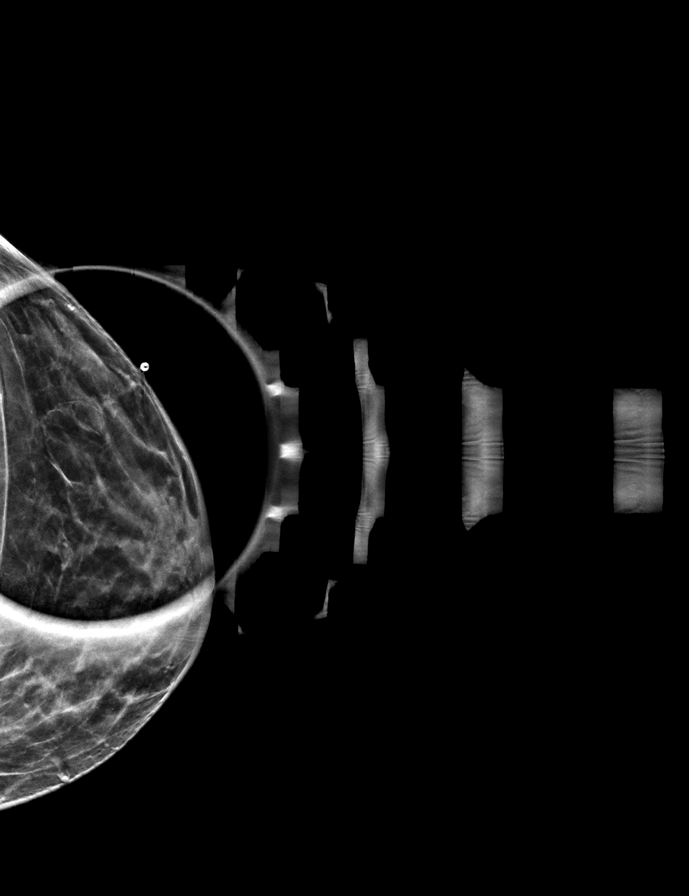

[R CC synth-2D]
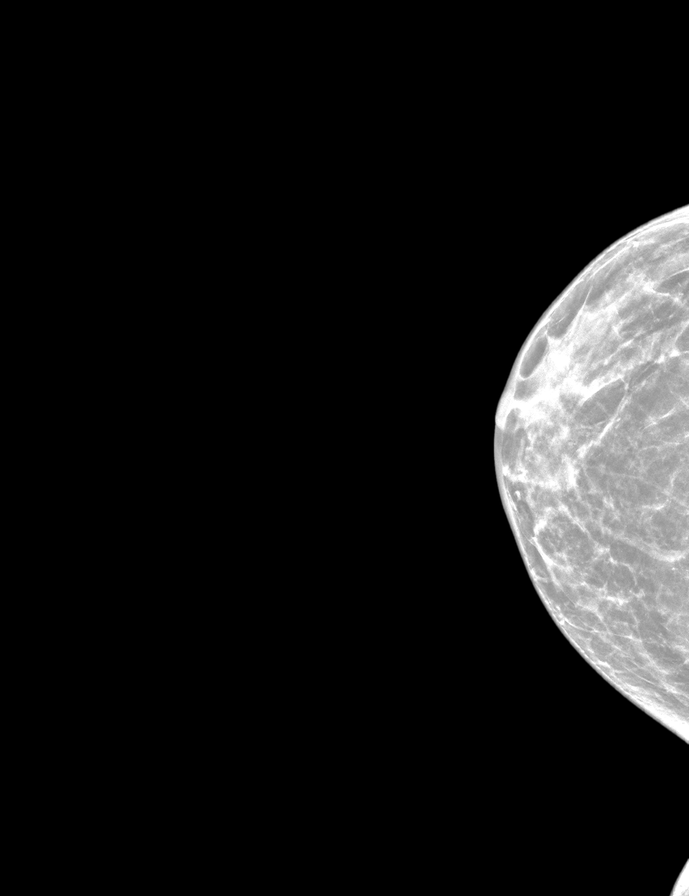

[R MLO synth-2D]
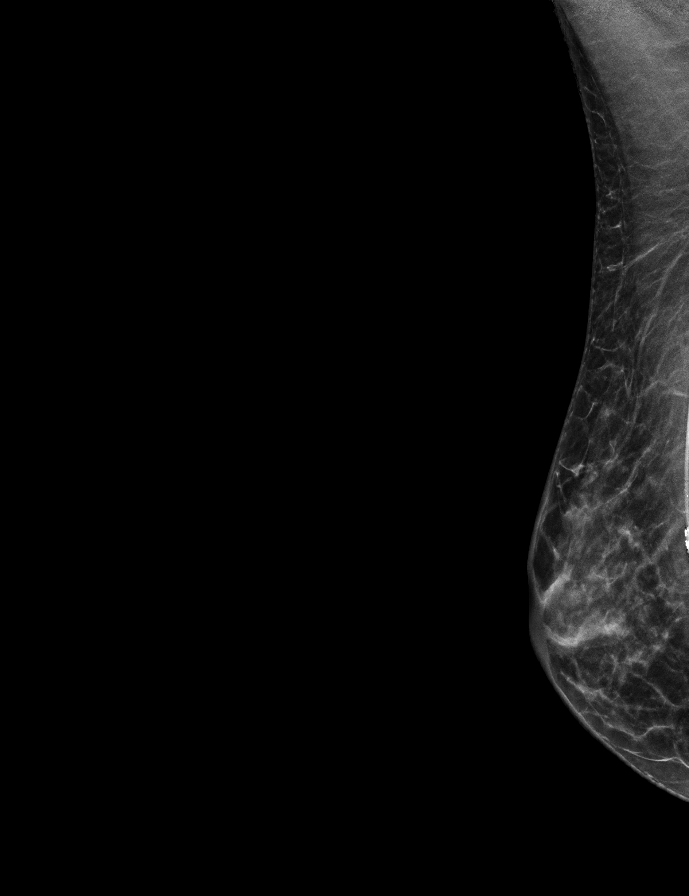

[L MLO synth-2D]
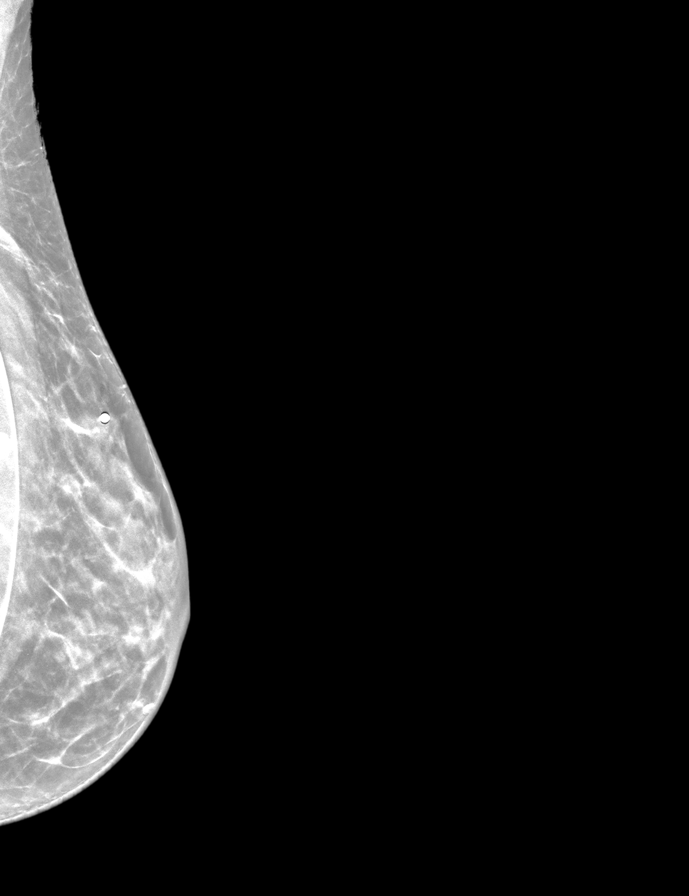

[8 of 34 positions shown; findings below may reference images not displayed]

ACR Breast Density Category c: The breast tissue is heterogeneously
dense, which may obscure small masses.
FINDINGS: No suspicious masses are found deep to the skin marker indicating
the palpable site of concern in the upper-outer left breast. No
suspicious calcifications, masses or areas of distortion are seen in
the bilateral breasts. The patient has retropectoral implants.

Ultrasound targeted to the palpable site in the left breast at 2
o'clock, 2 cm from the nipple demonstrates an anechoic oval
circumscribed mass measuring 0.7 x 0.3 x 0.5 cm.
IMPRESSION: 1. The palpable mass in the left breast corresponds with a benign
cyst.

2.  No mammographic evidence of malignancy in the bilateral breasts.

RECOMMENDATION:
Screening mammogram in one year.(Code:[id number])

I have discussed the findings and recommendations with the patient.
If applicable, a reminder letter will be sent to the patient
regarding the next appointment.

BI-RADS CATEGORY  2: Benign.

## 2023-11-23 IMAGING — US US BREAST*L* LIMITED INC AXILLA
1 series · 8 of 8 positions shown · non-contrast
Comparison: Previous exam(s).

CLINICAL DATA: 45-year-old female presenting for evaluation of a
palpable lump in the left breast for 3 months.

EXAM:
DIGITAL DIAGNOSTIC BILATERAL MAMMOGRAM WITH IMPLANTS, CAD AND
TOMOSYNTHESIS; ULTRASOUND LEFT BREAST LIMITED
TECHNIQUE: Bilateral digital diagnostic mammography and breast tomosynthesis
was performed. The images were evaluated with computer-aided
detection. Standard and/or implant displaced views were performed.;
Targeted ultrasound examination of the left breast was performed.

[Series 1: us breast*left* limited inc axilla · 0.06mm/px · 8 of 8 slices shown]
[im 1/8]
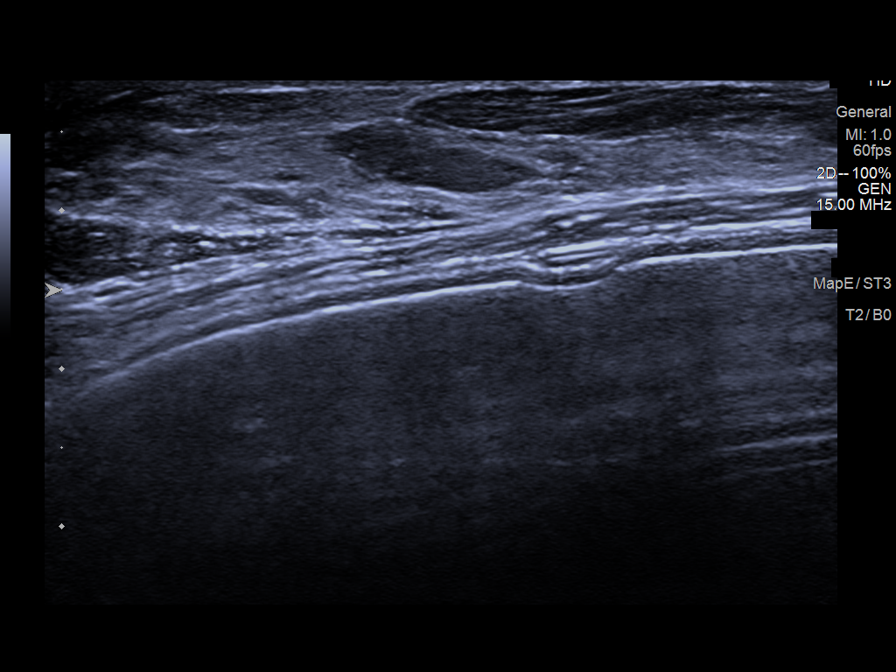
[im 2/8]
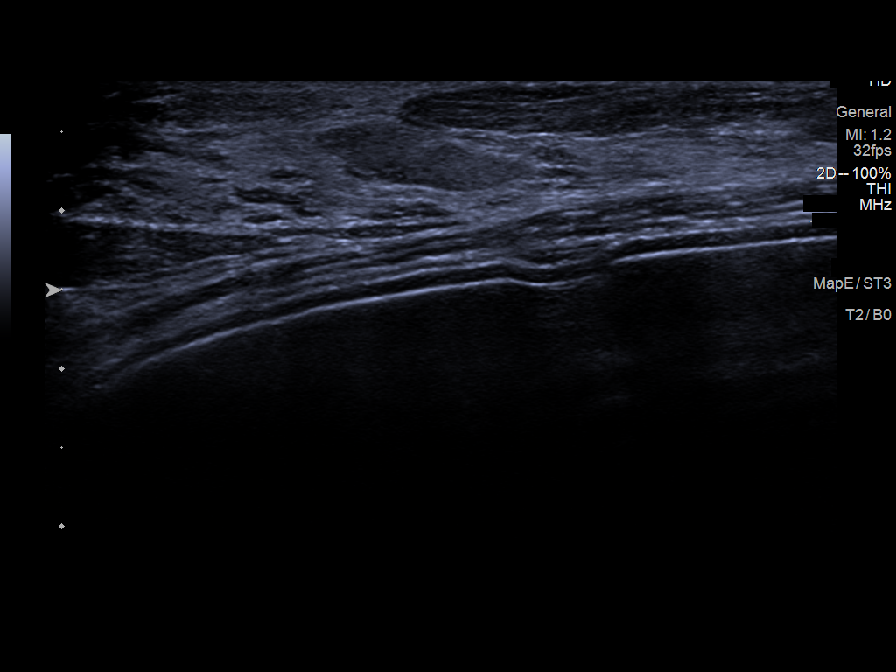
[im 3/8]
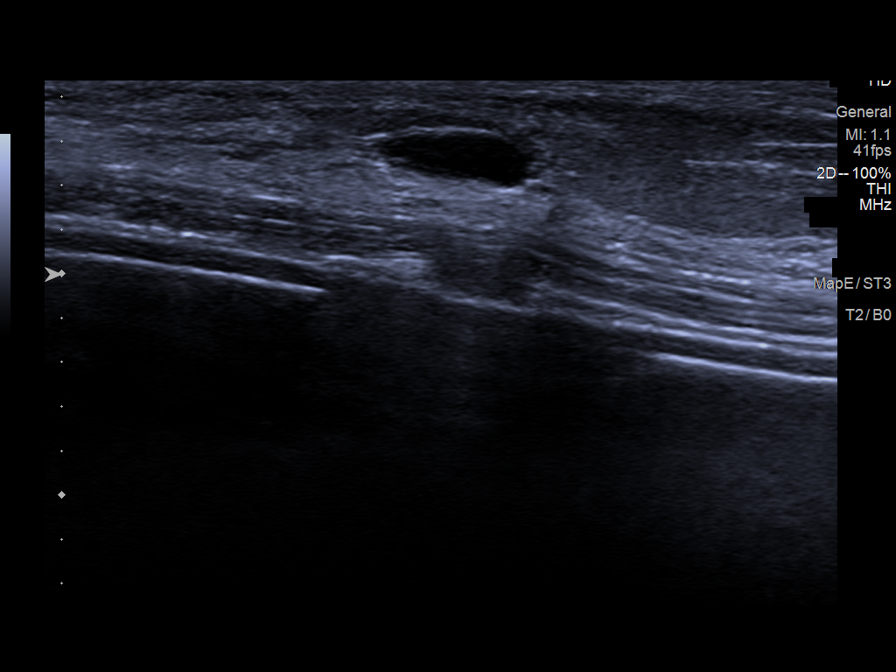
[im 4/8]
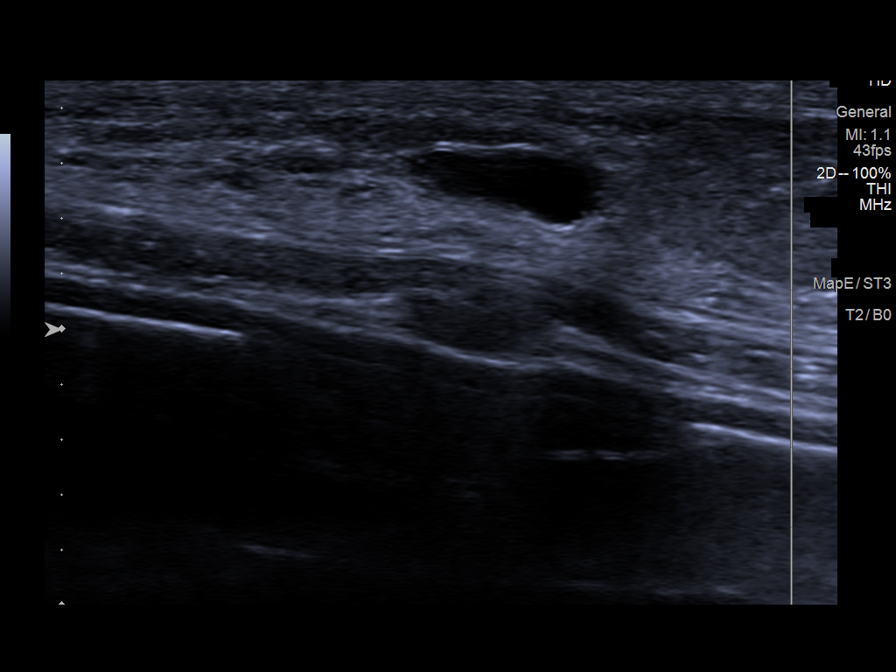
[im 5/8]
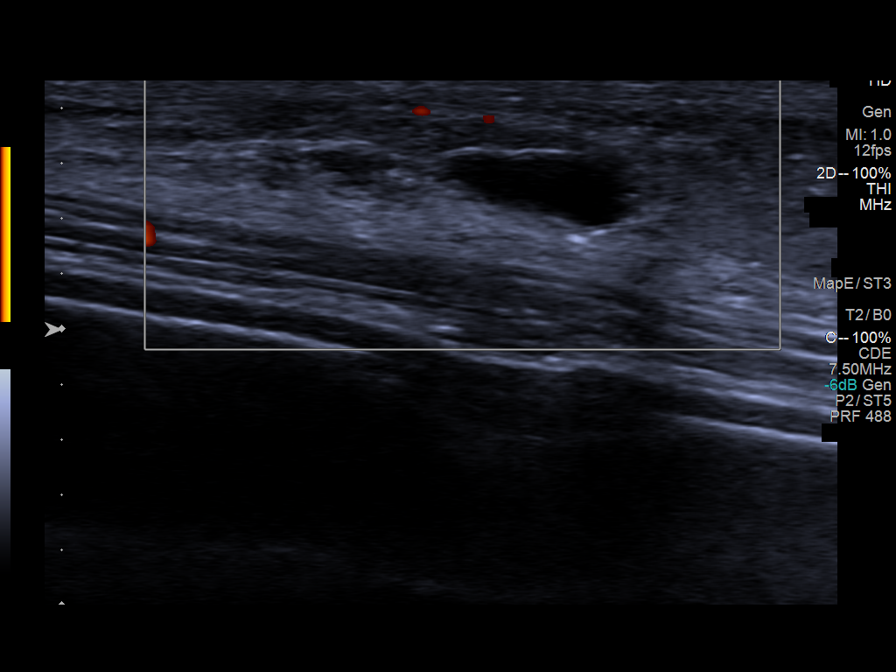
[im 6/8]
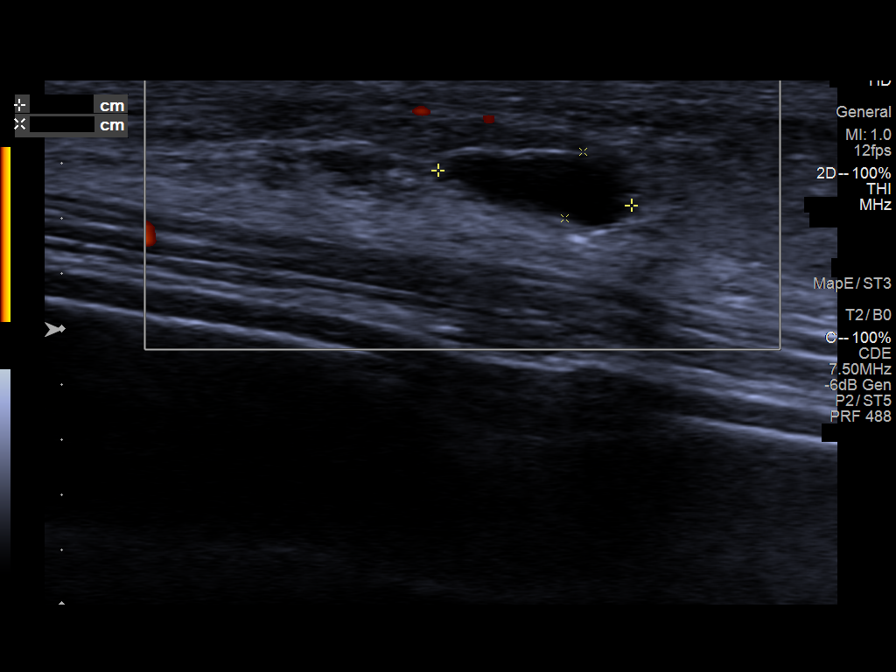
[im 7/8]
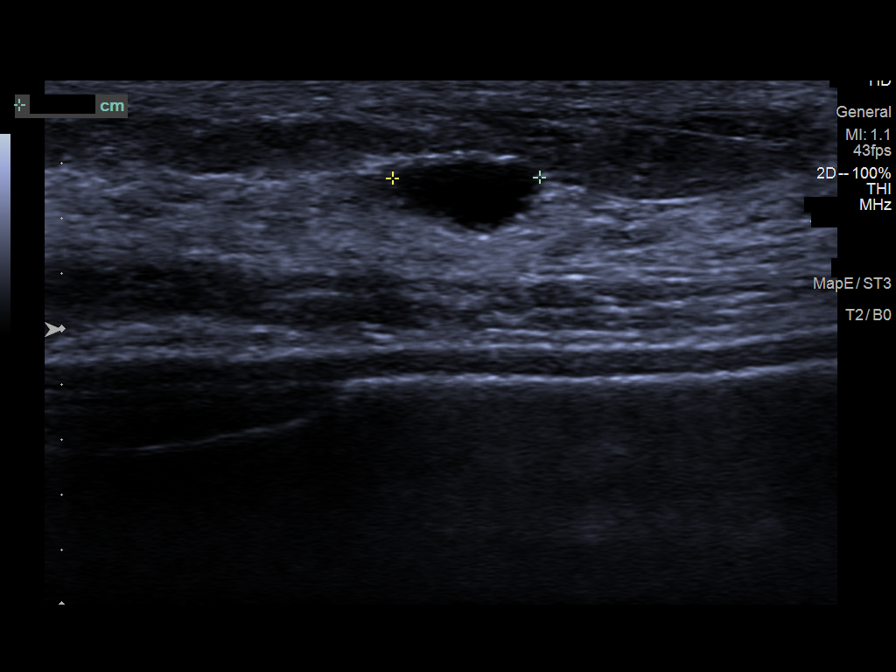
[im 8/8]
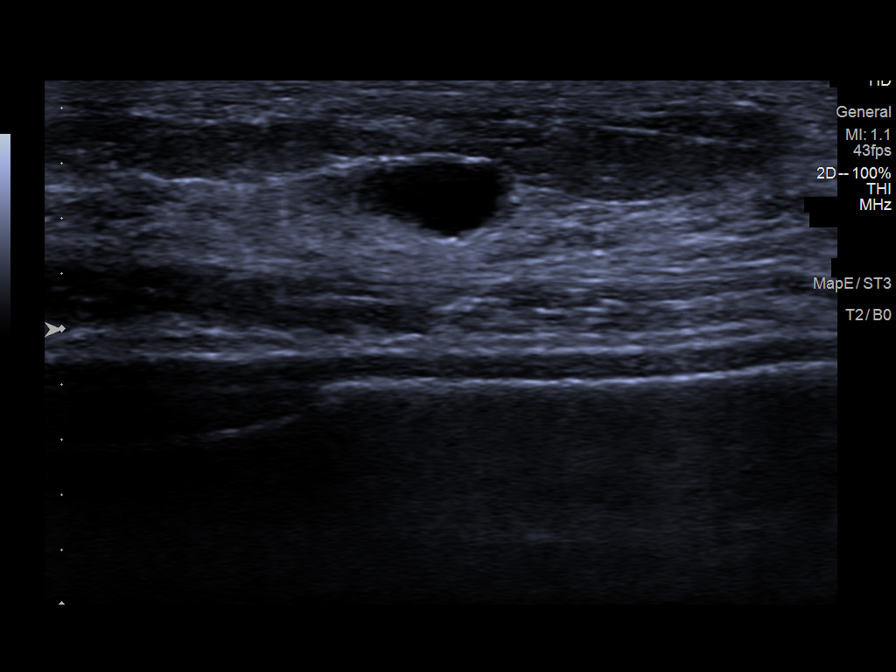

[8 of 8 positions shown; findings below may reference images not displayed]

ACR Breast Density Category c: The breast tissue is heterogeneously
dense, which may obscure small masses.
FINDINGS: No suspicious masses are found deep to the skin marker indicating
the palpable site of concern in the upper-outer left breast. No
suspicious calcifications, masses or areas of distortion are seen in
the bilateral breasts. The patient has retropectoral implants.

Ultrasound targeted to the palpable site in the left breast at 2
o'clock, 2 cm from the nipple demonstrates an anechoic oval
circumscribed mass measuring 0.7 x 0.3 x 0.5 cm.
IMPRESSION: 1. The palpable mass in the left breast corresponds with a benign
cyst.

2.  No mammographic evidence of malignancy in the bilateral breasts.

RECOMMENDATION:
Screening mammogram in one year.(Code:[id number])

I have discussed the findings and recommendations with the patient.
If applicable, a reminder letter will be sent to the patient
regarding the next appointment.

BI-RADS CATEGORY  2: Benign.
